# Patient Record
Sex: Female | Born: 2000 | Race: White | Hispanic: No | Marital: Single | Smoking: Never smoker
Health system: Southern US, Community
[De-identification: ages and names within clinical notes are randomized; demographics above are authoritative.]

## PROBLEM LIST (undated history)

## (undated) ENCOUNTER — Inpatient Hospital Stay (HOSPITAL_COMMUNITY): Payer: Self-pay

## (undated) DIAGNOSIS — D649 Anemia, unspecified: Secondary | ICD-10-CM

## (undated) DIAGNOSIS — B999 Unspecified infectious disease: Secondary | ICD-10-CM

## (undated) DIAGNOSIS — F32A Depression, unspecified: Secondary | ICD-10-CM

## (undated) HISTORY — PX: NO PAST SURGERIES: SHX2092

## (undated) NOTE — *Deleted (*Deleted)
POSTPARTUM PROGRESS NOTE  Subjective: Dawn Krause is a 38 y.o. [id number] s/p ***LTCS at [redacted]w[redacted]d.  She reports she doing well. No acute events overnight. She denies any problems with ambulating, voiding or po intake. Denies nausea or vomiting. She has *** passed flatus. Pain is {DESC; WELL/MODERATELY/POORLY:30679} controlled.  Lochia is ***.  Objective: Blood pressure (!) 101/53, pulse 93, temperature 98 F (36.7 C), temperature source Oral, resp. rate 18, SpO2 99 %, unknown if currently breastfeeding.  Physical Exam:  General: alert, cooperative and no distress Chest: no respiratory distress Abdomen: soft, non-tender  Uterine Fundus: firm and at level of umbilicus Extremities: No calf swelling or tenderness  *** edema  Recent Labs    03/16/20 1356  HGB 8.1*  HCT 27.7*    Assessment/Plan: Dawn Krause is a 50 y.o. A5W0981 s/p *** at [redacted]w[redacted]d for ***.  Routine Postpartum Care: Doing well, pain well-controlled.  -- Continue routine care, lactation support  -- Contraception: *** -- Feeding: ***  Dispo: Plan for discharge ***.  Sheila Oats, MD OB Fellow, Faculty Practice 03/17/2020 7:31 AM

---

## 2019-05-02 NOTE — L&D Delivery Note (Signed)
LABOR COURSE Patient was admitted for SOL. She SROM'd around 1600 hours and progressed to complete without further intervention.  Delivery Note Called to room and patient was complete and pushing. Head delivered LOT. No nuchal cord present. Shoulder and body delivered in usual fashion. At 1745 a viable female was delivered via Vaginal, Spontaneous (Presentation: LOT ; LOA).  Infant with spontaneous cry, placed on mother's abdomen, dried and stimulated. Cord clamped x 2 after one-minute delay, and cut by FOB Khalil. Cord blood drawn. Placenta delivered spontaneously with gentle cord traction. Appears intact. Fundus firm with massage and Pitocin. Labia, perineum, vagina, and cervix inspected. Per discussion with Dr. Adrian Blackwater, in setting of asymptomatic anemia, TXA given prophylactically    APGAR:9, 9; weight: 3246g  .   Cord: 3VC with the following complications: None.   Cord pH: not collected, not indicated  Anesthesia:  Epidural Episiotomy: None Lacerations: None Est. Blood Loss (mL): 50  Mom to postpartum.  Baby to Couplet care / Skin to Skin.  Clayton Bibles, CNM 03/16/20 9:06 PM

## 2020-03-16 ENCOUNTER — Inpatient Hospital Stay (HOSPITAL_COMMUNITY)
Admission: AD | Admit: 2020-03-16 | Discharge: 2020-03-18 | DRG: 807 | Disposition: A | Discharge status: Home / Self Care | Payer: Medicaid - Out of State | Attending: Family Medicine | Admitting: Family Medicine

## 2020-03-16 ENCOUNTER — Other Ambulatory Visit: Payer: Self-pay

## 2020-03-16 ENCOUNTER — Encounter (HOSPITAL_COMMUNITY): Payer: Self-pay | Admitting: Obstetrics and Gynecology

## 2020-03-16 ENCOUNTER — Inpatient Hospital Stay (HOSPITAL_COMMUNITY): Payer: Medicaid - Out of State | Admitting: Anesthesiology

## 2020-03-16 DIAGNOSIS — O9903 Anemia complicating the puerperium: Secondary | ICD-10-CM | POA: Diagnosis not present

## 2020-03-16 DIAGNOSIS — Z9889 Other specified postprocedural states: Secondary | ICD-10-CM | POA: Diagnosis not present

## 2020-03-16 DIAGNOSIS — O9902 Anemia complicating childbirth: Principal | ICD-10-CM | POA: Diagnosis present

## 2020-03-16 DIAGNOSIS — Z3A39 39 weeks gestation of pregnancy: Secondary | ICD-10-CM | POA: Diagnosis not present

## 2020-03-16 DIAGNOSIS — D649 Anemia, unspecified: Secondary | ICD-10-CM

## 2020-03-16 DIAGNOSIS — Z20822 Contact with and (suspected) exposure to covid-19: Secondary | ICD-10-CM | POA: Diagnosis present

## 2020-03-16 DIAGNOSIS — D509 Iron deficiency anemia, unspecified: Secondary | ICD-10-CM | POA: Diagnosis present

## 2020-03-16 DIAGNOSIS — O26893 Other specified pregnancy related conditions, third trimester: Secondary | ICD-10-CM | POA: Diagnosis present

## 2020-03-16 DIAGNOSIS — O0930 Supervision of pregnancy with insufficient antenatal care, unspecified trimester: Secondary | ICD-10-CM

## 2020-03-16 HISTORY — DX: Anemia, unspecified: D64.9

## 2020-03-16 HISTORY — DX: Unspecified infectious disease: B99.9

## 2020-03-16 LAB — WET PREP, GENITAL
Sperm: NONE SEEN
Trich, Wet Prep: NONE SEEN
Yeast Wet Prep HPF POC: NONE SEEN

## 2020-03-16 LAB — RAPID URINE DRUG SCREEN, HOSP PERFORMED
Amphetamines: NOT DETECTED
Barbiturates: NOT DETECTED
Benzodiazepines: NOT DETECTED
Cocaine: NOT DETECTED
Opiates: NOT DETECTED
Tetrahydrocannabinol: NOT DETECTED

## 2020-03-16 LAB — RESPIRATORY PANEL BY RT PCR (FLU A&B, COVID)
Influenza A by PCR: NEGATIVE
Influenza B by PCR: NEGATIVE
SARS Coronavirus 2 by RT PCR: NEGATIVE

## 2020-03-16 LAB — URINALYSIS, ROUTINE W REFLEX MICROSCOPIC
Bilirubin Urine: NEGATIVE
Glucose, UA: NEGATIVE mg/dL
Hgb urine dipstick: NEGATIVE
Ketones, ur: NEGATIVE mg/dL
Nitrite: NEGATIVE
Protein, ur: NEGATIVE mg/dL
Specific Gravity, Urine: 1.012 (ref 1.005–1.030)
pH: 7 (ref 5.0–8.0)

## 2020-03-16 LAB — CBC
HCT: 27.7 % — ABNORMAL LOW (ref 36.0–46.0)
Hemoglobin: 8.1 g/dL — ABNORMAL LOW (ref 12.0–15.0)
MCH: 21.3 pg — ABNORMAL LOW (ref 26.0–34.0)
MCHC: 29.2 g/dL — ABNORMAL LOW (ref 30.0–36.0)
MCV: 72.7 fL — ABNORMAL LOW (ref 80.0–100.0)
Platelets: 184 10*3/uL (ref 150–400)
RBC: 3.81 MIL/uL — ABNORMAL LOW (ref 3.87–5.11)
RDW: 16 % — ABNORMAL HIGH (ref 11.5–15.5)
WBC: 15.9 10*3/uL — ABNORMAL HIGH (ref 4.0–10.5)
nRBC: 0.1 % (ref 0.0–0.2)

## 2020-03-16 LAB — TYPE AND SCREEN
ABO/RH(D): A POS
Antibody Screen: NEGATIVE

## 2020-03-16 MED ORDER — OXYTOCIN BOLUS FROM INFUSION
333.0000 mL | Freq: Once | INTRAVENOUS | Status: AC
  Administered 2020-03-16: 333 mL via INTRAVENOUS

## 2020-03-16 MED ORDER — COCONUT OIL OIL: 1.0000 "application " | TOPICAL_OIL | Status: DC | PRN

## 2020-03-16 MED ORDER — IBUPROFEN 600 MG PO TABS: 600.0000 mg | ORAL_TABLET | Freq: Once | ORAL | Status: DC

## 2020-03-16 MED ORDER — BENZOCAINE-MENTHOL 20-0.5 % EX AERO: 1.0000 "application " | INHALATION_SPRAY | CUTANEOUS | Status: DC | PRN

## 2020-03-16 MED ORDER — IBUPROFEN 600 MG PO TABS
600.0000 mg | ORAL_TABLET | Freq: Four times a day (QID) | ORAL | Status: DC
  Administered 2020-03-16 – 2020-03-18 (×6): 600 mg via ORAL
  Filled 2020-03-16 (×5): qty 1

## 2020-03-16 MED ORDER — PHENYLEPHRINE 40 MCG/ML (10ML) SYRINGE FOR IV PUSH (FOR BLOOD PRESSURE SUPPORT): 80.0000 ug | PREFILLED_SYRINGE | INTRAVENOUS | Status: DC | PRN

## 2020-03-16 MED ORDER — SOD CITRATE-CITRIC ACID 500-334 MG/5ML PO SOLN: 30.0000 mL | ORAL | Status: DC | PRN

## 2020-03-16 MED ORDER — TETANUS-DIPHTH-ACELL PERTUSSIS 5-2.5-18.5 LF-MCG/0.5 IM SUSY: 0.5000 mL | PREFILLED_SYRINGE | Freq: Once | INTRAMUSCULAR | Status: DC

## 2020-03-16 MED ORDER — MAGNESIUM HYDROXIDE 400 MG/5ML PO SUSP: 30.0000 mL | ORAL | Status: DC | PRN

## 2020-03-16 MED ORDER — WITCH HAZEL-GLYCERIN EX PADS: 1.0000 "application " | MEDICATED_PAD | CUTANEOUS | Status: DC | PRN

## 2020-03-16 MED ORDER — SODIUM CHLORIDE (PF) 0.9 % IJ SOLN
INTRAMUSCULAR | Status: DC | PRN
  Administered 2020-03-16: 12 mL/h via EPIDURAL

## 2020-03-16 MED ORDER — EPHEDRINE 5 MG/ML INJ: 10.0000 mg | INTRAVENOUS | Status: DC | PRN

## 2020-03-16 MED ORDER — PROMETHAZINE HCL 25 MG/ML IJ SOLN
25.0000 mg | Freq: Once | INTRAMUSCULAR | Status: AC
  Administered 2020-03-16: 25 mg via INTRAMUSCULAR
  Filled 2020-03-16: qty 1

## 2020-03-16 MED ORDER — TRANEXAMIC ACID-NACL 1000-0.7 MG/100ML-% IV SOLN: 1000.0000 mg | Freq: Once | INTRAVENOUS | Status: DC

## 2020-03-16 MED ORDER — FENTANYL-BUPIVACAINE-NACL 0.5-0.125-0.9 MG/250ML-% EP SOLN
12.0000 mL/h | EPIDURAL | Status: DC | PRN
  Filled 2020-03-16: qty 250

## 2020-03-16 MED ORDER — LACTATED RINGERS IV SOLN
500.0000 mL | INTRAVENOUS | Status: DC | PRN
  Administered 2020-03-16: 1000 mL via INTRAVENOUS

## 2020-03-16 MED ORDER — LIDOCAINE HCL (PF) 1 % IJ SOLN: 30.0000 mL | INTRAMUSCULAR | Status: DC | PRN

## 2020-03-16 MED ORDER — TRANEXAMIC ACID-NACL 1000-0.7 MG/100ML-% IV SOLN
INTRAVENOUS | Status: AC
  Administered 2020-03-16: 1000 mg
  Filled 2020-03-16: qty 100

## 2020-03-16 MED ORDER — OXYTOCIN-SODIUM CHLORIDE 30-0.9 UT/500ML-% IV SOLN
2.5000 [IU]/h | INTRAVENOUS | Status: DC
  Filled 2020-03-16: qty 500

## 2020-03-16 MED ORDER — ACETAMINOPHEN 325 MG PO TABS: 650.0000 mg | ORAL_TABLET | ORAL | Status: DC | PRN

## 2020-03-16 MED ORDER — LIDOCAINE HCL (PF) 1 % IJ SOLN
INTRAMUSCULAR | Status: DC | PRN
  Administered 2020-03-16: 5 mL via EPIDURAL

## 2020-03-16 MED ORDER — DIBUCAINE (PERIANAL) 1 % EX OINT: 1.0000 "application " | TOPICAL_OINTMENT | CUTANEOUS | Status: DC | PRN

## 2020-03-16 MED ORDER — LACTATED RINGERS IV SOLN: 500.0000 mL | Freq: Once | INTRAVENOUS | Status: DC

## 2020-03-16 MED ORDER — ONDANSETRON HCL 4 MG/2ML IJ SOLN: 4.0000 mg | INTRAMUSCULAR | Status: DC | PRN

## 2020-03-16 MED ORDER — BUTORPHANOL TARTRATE 1 MG/ML IJ SOLN
0.5000 mg | Freq: Once | INTRAMUSCULAR | Status: AC
  Administered 2020-03-16: 0.5 mg via INTRAMUSCULAR
  Filled 2020-03-16: qty 1

## 2020-03-16 MED ORDER — DIPHENHYDRAMINE HCL 25 MG PO CAPS: 25.0000 mg | ORAL_CAPSULE | Freq: Four times a day (QID) | ORAL | Status: DC | PRN

## 2020-03-16 MED ORDER — LACTATED RINGERS IV SOLN: INTRAVENOUS | Status: DC

## 2020-03-16 MED ORDER — ONDANSETRON HCL 4 MG PO TABS: 4.0000 mg | ORAL_TABLET | ORAL | Status: DC | PRN

## 2020-03-16 MED ORDER — FERROUS SULFATE 325 (65 FE) MG PO TABS: 325.0000 mg | ORAL_TABLET | Freq: Two times a day (BID) | ORAL | Status: DC

## 2020-03-16 MED ORDER — ONDANSETRON HCL 4 MG/2ML IJ SOLN: 4.0000 mg | Freq: Four times a day (QID) | INTRAMUSCULAR | Status: DC | PRN

## 2020-03-16 MED ORDER — SIMETHICONE 80 MG PO CHEW: 80.0000 mg | CHEWABLE_TABLET | ORAL | Status: DC | PRN

## 2020-03-16 MED ORDER — DIPHENHYDRAMINE HCL 50 MG/ML IJ SOLN: 12.5000 mg | INTRAMUSCULAR | Status: DC | PRN

## 2020-03-16 MED ORDER — PRENATAL MULTIVITAMIN CH
1.0000 | ORAL_TABLET | Freq: Every day | ORAL | Status: DC
  Administered 2020-03-17 – 2020-03-18 (×2): 1 via ORAL
  Filled 2020-03-16 (×2): qty 1

## 2020-03-16 NOTE — Discharge Instructions (Signed)

## 2020-03-16 NOTE — Progress Notes (Signed)
Dawn Krause is a 19 y.o. G2P1001 at [redacted]w[redacted]d   Subjective: S/p epidural. Dawn Krause and Khalil's mom at bedside.  Objective: BP (!) 96/54   Pulse 91   Temp 98.6 F (37 C) (Oral)   Resp 16   SpO2 100%  No intake/output data recorded. No intake/output data recorded.  FHT:  FHR: 120 bpm, variability: moderate,  accelerations:  Present,  decelerations:  Absent UC:   regular, every 2-4 minutes SVE:   Dilation: 8 Effacement (%): 100 Station: -2 Exam by:: k fields, rn  Labs: Lab Results  Component Value Date   WBC 15.9 (H) 03/16/2020   HGB 8.1 (L) 03/16/2020   HCT 27.7 (L) 03/16/2020   MCV 72.7 (L) 03/16/2020   PLT 184 03/16/2020    Assessment / Plan: --19 y.o. G2P1001 at [redacted]w[redacted]d  --Cat I tracing --Epidural placed and effective, pain score 0/10 --SROM at 1600, clear fluid --Patient to nap for ~1 hour (1715), then initiate position changes with peanut ball --Anticipate NSVD  Calvert Cantor, CNM 03/16/2020, 4:20 PM

## 2020-03-16 NOTE — Progress Notes (Signed)
Dawn Krause is a 19 y.o. G2P1001 at [redacted]w[redacted]d admitted for SOL  Subjective: Tearful and vocal with contractions. Requests epidural and believes she will be able to tolerate position while it is placed. Verbalizes "I just want it to be over". FOB Heron Nay and Khalil's mom at bedside.   Objective: BP 129/72   Pulse (!) 110   Temp 98 F (36.7 C) (Oral)   Resp 19   SpO2 98%  No intake/output data recorded. No intake/output data recorded.  FHT:  FHR: 125 bpm, variability: moderate,  Accelerations:15 x 15 prior to Stadol and Phenergan in MAU,  decelerations:  Absent UC:   irregular, every 2-5 minutes SVE:   Dilation: 7 Effacement (%): 100 Station: -2 Exam by:: k fields, rn  Labs: Lab Results  Component Value Date   WBC 15.9 (H) 03/16/2020   HGB 8.1 (L) 03/16/2020   HCT 27.7 (L) 03/16/2020   MCV 72.7 (L) 03/16/2020   PLT 184 03/16/2020    Assessment / Plan: --19 y.o. G2P1001 at [redacted]w[redacted]d  --Cat I tracing prior to analgesia in MAU --Epidural now --AROM once comfortable with epidural --Admission Hgb 8.1, plan preemptively give TXA at delivery. Coordinated with Dr. Adrian Blackwater --Anticipate NSVD  Calvert Cantor, CNM 03/16/2020, 2:50 PM

## 2020-03-16 NOTE — H&P (Signed)
Dawn Krause is a 19 y.o. female presenting for spontaneous onset of labor. She states she woke up with painful contractions this morning. She denies vaginal bleeding, leaking of fluid, decreased fetal movement, fever, falls, or recent illness.   Patient is new to [place]; she recently moved from [place]. She states she has not been seen for prenatal care in about 4 weeks. She denies complications this pregnancy. Patient was unable to attend the iron infusion appointments made for her but endorses taking PO iron "most of the time".She is s/p vaginal delivery 12/15/2018. She is accompanied by her partner Heron Nay.  OB History    Gravida  2   Para  1   Term  1   Preterm      AB      Living  1     SAB      TAB      Ectopic      Multiple      Live Births  1          Past Medical History:  Diagnosis Date   Anemia    Infection    UTI   Past Surgical History:  Procedure Laterality Date   NO PAST SURGERIES     Family History: family history includes Diabetes in her mother; Healthy in her father; Hypertension in her mother. Social History:  reports that she has never smoked. She has never used smokeless tobacco. She reports that she does not drink alcohol and does not use drugs.    Maternal Diabetes: No Genetic Screening: Declined Maternal Ultrasounds/Referrals: Normal Fetal Ultrasounds or other Referrals:  None Maternal Substance Abuse:  No UDS ordered on admission Significant Maternal Medications:  None Significant Maternal Lab Results:  None  GBS Unknown Other Comments:  Interrupted prenatal care  Review of Systems  Gastrointestinal: Positive for abdominal pain.  All other systems reviewed and are negative.    Dilation: 3 Effacement (%): 80 Exam by:: Anna C Blood pressure (!) 129/59, pulse 99, temperature 97.9 F (36.6 C), temperature source Oral, SpO2 98 %.   Physical Exam Vitals and nursing note reviewed. Exam conducted with a chaperone present.   Cardiovascular:     Rate and Rhythm: Normal rate.     Pulses: Normal pulses.     Heart sounds: Normal heart sounds.  Pulmonary:     Effort: Pulmonary effort is normal.     Breath sounds: Normal breath sounds.  Abdominal:     Tenderness: There is no abdominal tenderness. There is no right CVA tenderness or left CVA tenderness.     Comments: Gravid  Skin:    General: Skin is warm and dry.     Capillary Refill: Capillary refill takes less than 2 seconds.  Neurological:     General: No focal deficit present.     Mental Status: She is alert.  Psychiatric:        Mood and Affect: Mood normal.        Behavior: Behavior normal.        Thought Content: Thought content normal.        Judgment: Judgment normal.      Prenatal labs: ABO, Rh: --/--/PENDING (11/16 1348) Antibody: PENDING (11/16 1348) Rubella:  Immune  RPR:    HBsAg:   Neg Hep C Negative HIV:    GBS:   Unknown, collected in MAU TDAP: 01/07/2020  Fetal Surveillance --Cat I tracing --Baseline 120, mod var, + 15 x 15 accels, no decels --Toco: Reg contractions q  3 min   Assessment/Plan: --19 y.o. G2P1001 at [redacted]w[redacted]d  --Interrupted prenatal care, records visible in Care Everywhere --Hgb 8.6 as of 01/07/2020. Currently asymptomatic --SOL, cervical change from 3/80 to 5/100 in MAU per RN exam --Cat I tracing --Membranes intact --GBS unknown: does not meet criteria for treatment --Epidural when labs result --boy/oupt circ/breast/uncertain --Expectant management, anticipate NSVD  Postpartum Planning --Social work consult --Contraception discussion --4 week postpartum with [facility]  Calvert Cantor, CNM 03/16/2020, 2:12 PM

## 2020-03-16 NOTE — MAU Note (Signed)
Care in [place], last  Seen about a month ago. No problems with preg. No bleeding or LOF, had mucous d/c last couple days. No recent exam.  ctxs woke her this morinig

## 2020-03-16 NOTE — Anesthesia Preprocedure Evaluation (Signed)
Anesthesia Evaluation  Patient identified by MRN, date of birth, ID band Patient awake    Reviewed: Allergy & Precautions, NPO status , Patient's Chart, lab work & pertinent test results  Airway Mallampati: II  TM Distance: >3 FB Neck ROM: Full    Dental no notable dental hx. (+) Teeth Intact, Dental Advisory Given   Pulmonary neg pulmonary ROS,    Pulmonary exam normal breath sounds clear to auscultation       Cardiovascular Exercise Tolerance: Good negative cardio ROS Normal cardiovascular exam Rhythm:Regular Rate:Normal     Neuro/Psych negative neurological ROS  negative psych ROS   GI/Hepatic negative GI ROS, Neg liver ROS,   Endo/Other  negative endocrine ROS  Renal/GU negative Renal ROS     Musculoskeletal negative musculoskeletal ROS (+)   Abdominal   Peds  Hematology  (+) anemia , Hgb 8.1 Plt 184   Anesthesia Other Findings   Reproductive/Obstetrics (+) Pregnancy                             Anesthesia Physical Anesthesia Plan  ASA: II  Anesthesia Plan: Epidural   Post-op Pain Management:    Induction:   PONV Risk Score and Plan:   Airway Management Planned:   Additional Equipment:   Intra-op Plan:   Post-operative Plan:   Informed Consent: I have reviewed the patients History and Physical, chart, labs and discussed the procedure including the risks, benefits and alternatives for the proposed anesthesia with the patient or authorized representative who has indicated his/her understanding and acceptance.       Plan Discussed with: CRNA  Anesthesia Plan Comments: (39.2 wk G2P1  w Anemia for LEA)        Anesthesia Quick Evaluation

## 2020-03-16 NOTE — Discharge Summary (Signed)
Postpartum Discharge Summary    Patient Name: Dawn Krause DOB: [birth year] MRN: [id number]  Date of admission: 03/16/2020 Delivery date:03/16/2020  Delivering provider: Darlina Rumpf  Date of discharge: 03/18/2020  Admitting diagnosis: Labor and delivery indication for care or intervention [O75.9] Intrauterine pregnancy: [redacted]w[redacted]d    Secondary diagnosis:  Active Problems:   Anemia   Insufficient prenatal care   NSVD (normal spontaneous vaginal delivery)   Labor and delivery indication for care or intervention  Additional problems: as noted above   Discharge diagnosis: Vaginal delivery                                           Post partum procedures:none Augmentation: none Complications: None  Hospital course: Onset of Labor With Vaginal Delivery      19y.o. yo [id number] 332w2das admitted in Latent Labor on 03/16/2020. Patient had an uncomplicated labor course as follows:  Membrane Rupture Time/Date: 4:04 PM ,03/16/2020   Delivery Method:Vaginal, Spontaneous  Episiotomy: None  Lacerations:  None  Patient had an uncomplicated postpartum course.  She is ambulating, tolerating a regular diet, passing flatus, and urinating well. Patient is discharged home in stable condition on 03/18/20.  Newborn Data: Birth date:03/16/2020  Birth time:5:45 PM  Gender:Female  Living status:Living  Apgars:9 ,9  Weight:3246 g   Magnesium Sulfate received: No BMZ received: No Rhophylac:N/A MMR: rubella screen pending at time of discharge T-DaP: offered prior to discharge Flu: offered prior to discharge Transfusion:No  Physical exam  Vitals:   03/17/20 0910 03/17/20 1413 03/17/20 1945 03/18/20 0530  BP: 107/61 (!) 106/57 116/77 113/70  Pulse: 89 97 92 100  Resp: _0 Temp: 98.2 F (36.8 C) 98.1 F (36.7 C) 98 F (36.7 C) 98.2 F (36.8 C)  TempSrc: Oral Oral Oral Oral  SpO2:  99%     General: alert, cooperative and no distress Lochia: appropriate Uterine Fundus:  firm Incision: N/A DVT Evaluation: No evidence of DVT seen on physical exam. No cords or calf tenderness. No significant calf/ankle edema. Labs: Lab Results  Component Value Date   WBC 14.7 (H) 03/17/2020   HGB 7.6 (L) 03/17/2020   HCT 25.5 (L) 03/17/2020   MCV 74.1 (L) 03/17/2020   PLT 166 03/17/2020   No flowsheet data found. Edinburgh Score: Edinburgh Postnatal Depression Scale Screening Tool 03/16/2020  I have been able to laugh and see the funny side of things. 0  I have looked forward with enjoyment to things. 0  I have blamed myself unnecessarily when things went wrong. 0  I have been anxious or worried for no good reason. 0  I have felt scared or panicky for no good reason. 0  Things have been getting on top of me. 0  I have been so unhappy that I have had difficulty sleeping. 0  I have felt sad or miserable. 0  I have been so unhappy that I have been crying. 0  The thought of harming myself has occurred to me. 0  Edinburgh Postnatal Depression Scale Total 0     After visit meds:  Allergies as of 03/18/2020   No Known Allergies     Medication List    TAKE these medications   acetaminophen 325 MG tablet Commonly known as: Tylenol Take 2 tablets (650 mg total) by mouth every 6 (six) hours as needed  for mild pain, moderate pain, fever or headache. What changed:   how much to take  reasons to take this   coconut oil Oil Apply 1 application topically as needed (nipple pain).   ferrous sulfate 325 (65 FE) MG tablet Take 1 tablet (325 mg total) by mouth every other day. Start taking on: March 19, 2020   ibuprofen 600 MG tablet Commonly known as: ADVIL Take 1 tablet (600 mg total) by mouth every 8 (eight) hours as needed for headache, mild pain, moderate pain or cramping.   norethindrone 0.35 MG tablet Commonly known as: Ortho Micronor Take 1 tablet (0.35 mg total) by mouth daily.   PRENATAL VITAMIN PO Take by mouth.      Discharge home in stable  condition Infant Feeding: No evidence of DVT seen on physical exam. No cords or calf tenderness. No significant calf/ankle edema. Infant Disposition:home with mother Discharge instruction: per After Visit Summary and Postpartum booklet. Activity: Advance as tolerated. Pelvic rest for 6 weeks.  Diet: routine diet Future Appointments: Future Appointments  Date Time Provider [place]  04/15/2020  2:00 PM Gavin Pound, CNM [facility] None   Follow up Visit: Message sent to [facility] by Jeronimo Greaves, CNM on 03/16/2020.  Please schedule this patient for a In person postpartum visit in 4 weeks with the following provider: Any provider. Additional Postpartum F/U:Postpartum Depression checkup 1 week Low risk pregnancy complicated by: N/A Delivery mode:  Vaginal, Spontaneous  Anticipated Birth Control:  POPs  Christionna Poland, Gildardo Cranker, MD OB Fellow, Faculty Practice 03/18/2020 6:52 AM

## 2020-03-16 NOTE — Anesthesia Procedure Notes (Signed)
Epidural Patient location during procedure: OB Start time: 03/16/2020 2:52 PM End time: 03/16/2020 3:06 PM  Staffing Anesthesiologist: Trevor Iha, MD Performed: anesthesiologist   Preanesthetic Checklist Completed: patient identified, IV checked, site marked, risks and benefits discussed, surgical consent, monitors and equipment checked, pre-op evaluation and timeout performed  Epidural Patient position: sitting Prep: DuraPrep and site prepped and draped Patient monitoring: continuous pulse ox and blood pressure Approach: midline Location: L3-L4 Injection technique: LOR air  Needle:  Needle type: Tuohy  Needle gauge: 17 G Needle length: 9 cm and 9 Needle insertion depth: 5 cm cm Catheter type: closed end flexible Catheter size: 19 Gauge Catheter at skin depth: 10 cm Test dose: negative  Assessment Events: blood not aspirated, injection not painful, no injection resistance, no paresthesia and negative IV test  Additional Notes Patient identified. Risks/Benefits/Options discussed with patient including but not limited to bleeding, infection, nerve damage, paralysis, failed block, incomplete pain control, headache, blood pressure changes, nausea, vomiting, reactions to medication both or allergic, itching and postpartum back pain. Confirmed with bedside nurse the patient's most recent platelet count. Confirmed with patient that they are not currently taking any anticoagulation, have any bleeding history or any family history of bleeding disorders. Patient expressed understanding and wished to proceed. All questions were answered. Sterile technique was used throughout the entire procedure. Please see nursing notes for vital signs. Test dose was given through epidural needle and negative prior to continuing to dose epidural or start infusion. Warning signs of high block given to the patient including shortness of breath, tingling/numbness in hands, complete motor block, or any  concerning symptoms with instructions to call for help. Patient was given instructions on fall risk and not to get out of bed. All questions and concerns addressed with instructions to call with any issues.1  Attempt (S) . Patient tolerated procedure well.

## 2020-03-17 DIAGNOSIS — O9903 Anemia complicating the puerperium: Secondary | ICD-10-CM

## 2020-03-17 LAB — CBC
HCT: 25.5 % — ABNORMAL LOW (ref 36.0–46.0)
Hemoglobin: 7.6 g/dL — ABNORMAL LOW (ref 12.0–15.0)
MCH: 22.1 pg — ABNORMAL LOW (ref 26.0–34.0)
MCHC: 29.8 g/dL — ABNORMAL LOW (ref 30.0–36.0)
MCV: 74.1 fL — ABNORMAL LOW (ref 80.0–100.0)
Platelets: 166 10*3/uL (ref 150–400)
RBC: 3.44 MIL/uL — ABNORMAL LOW (ref 3.87–5.11)
RDW: 16.2 % — ABNORMAL HIGH (ref 11.5–15.5)
WBC: 14.7 10*3/uL — ABNORMAL HIGH (ref 4.0–10.5)
nRBC: 0.1 % (ref 0.0–0.2)

## 2020-03-17 LAB — GC/CHLAMYDIA PROBE AMP (~~LOC~~) NOT AT ARMC
Chlamydia: NEGATIVE
Comment: NEGATIVE
Comment: NORMAL
Neisseria Gonorrhea: NEGATIVE

## 2020-03-17 LAB — RPR: RPR Ser Ql: NONREACTIVE

## 2020-03-17 MED ORDER — FERROUS SULFATE 325 (65 FE) MG PO TABS
325.0000 mg | ORAL_TABLET | ORAL | Status: DC
  Administered 2020-03-17: 325 mg via ORAL
  Filled 2020-03-17: qty 1

## 2020-03-17 NOTE — Social Work (Signed)
CSW consulted for interrupted/insufficient prenatal care & short interval pregnancy. Also consulted for possible custody issues with 20 year old. Upon chart review, CSW noted MOB had an initial prenatal visit May 17 at [redacted]w[redacted]d CSW noted another "initial visit" dated on August 9, and a routine visit on Sept 8. MOB had prenatal care prior to 28 weeks and 3 total visits documented. Newborns UDS resulted negative for substances. CSW will not follow cord, as documented prenatal care does not meet criteria for automatic CSW involvement and infant drug screening.  CSW met with MOB to assess custody situation and offer support. CSW entered room and observed newborns paternal grandmother in chair and FOB on couch. Newborn was observed sleeping in bassinet. CSW asked MOB if she would like to speak alone for privacy and to respect HIPAA. MOB declined and stated everyone could remain in the room.  MOB reported [place] moved here from [place] MOB [place]. CSW asked MOB the details of custody surrounding her 11year old daughter ZZaelynn Fuchs(12/15/2018). MOB stated she missed a scheduled court date to determine custody due to a job she had traveling. MOB stated she did not realize she was mailed the court date and when it was missed the FOB was awarded custody. MOB does not have address for daughter, stating she knows they reside in [place] MOB stated she is still able to see her 142year old daughter and denies ever having any CPS involvement in [place] MOB reported she still has parental rights to her daughter.  MOB reports she receives food stamps and [facility] CS[facility] provided MOB information [facility]. MOB denies any mental health history and stated she is currently feeling okay. MOB denies any current SI or HI. CSW provided education regarding the baby blues  period vs. perinatal mood disorders, discussed treatment and offered resources for mental health follow up if concerns arise.  CSW recommends self-evaluation during the postpartum time period using the New Mom Checklist from Postpartum Progress and encouraged MOB to contact a medical professional if symptoms are noted at any time. MOB expressed understanding and denies previous experiencing PPD.     CSW provided review of Sudden Infant Death Syndrome (SIDS) precautions. MOB stated newborn will sleep in a crib. MOB reports she has all of the essential needs for newborn, including a new carseat. [facility] MOB denies any transportation barriers. MOB declined a referral to [employer]or other community resources. MOB expressed no addition concerns or needs at this time.     CSW identifies no further need for intervention and no barriers to discharge at this time.  CDarra Lis LGarden CityWork WEnterprise Productsand CMolson [employer]([phone]

## 2020-03-17 NOTE — Anesthesia Postprocedure Evaluation (Signed)
Anesthesia Post Note  Patient: Dawn Krause  Procedure(s) Performed: AN AD HOC LABOR EPIDURAL     Patient location during evaluation: Mother Baby Anesthesia Type: Epidural Level of consciousness: awake and alert Pain management: pain level controlled Vital Signs Assessment: post-procedure vital signs reviewed and stable Respiratory status: spontaneous breathing Cardiovascular status: stable Postop Assessment: no headache, adequate PO intake, no backache, patient able to bend at knees, able to ambulate, epidural receding and no apparent nausea or vomiting Anesthetic complications: no   No complications documented.  Last Vitals:  Vitals:   03/17/20 0115 03/17/20 0500  BP: 104/62 (!) 101/53  Pulse: 89 93  Resp: 16 18  Temp: 37.1 C 36.7 C  SpO2: 98% 99%    Last Pain:  Vitals:   03/17/20 0500  TempSrc: Oral  PainSc: 3    Pain Goal:                   Salome Arnt

## 2020-03-17 NOTE — Progress Notes (Addendum)
POSTPARTUM PROGRESS NOTE  Subjective: Dawn Krause is a 19 y.o. M3W4665 s/p SVD LTCS at [redacted]w[redacted]d.  She reports she doing well. No acute events overnight. She denies any problems with ambulating, voiding or po intake. Denies nausea or vomiting. She has passed flatus. Pain is well controlled.  Minimal lochia is present.  Objective: Blood pressure (!) 101/53, pulse 93, temperature 98 F (36.7 C), temperature source Oral, resp. rate 18, SpO2 99 %, unknown if currently breastfeeding.  Physical Exam:  General: alert, cooperative and no distress Chest: no respiratory distress Abdomen: soft, non-tender  Uterine Fundus: firm and below level of umbilicus Extremities: No calf swelling or tenderness, no LE edema noted  Recent Labs    03/16/20 1356  HGB 8.1*  HCT 27.7*    Assessment/Plan: Dawn Krause is a 19 y.o. [id number] PPD#1.  Anemia: Hgb 8.1 on admission, otherwise asymptomatic. PO iron supplements ordered. Will f/u PP H&H to ensure stable.  Routine Postpartum Care: Doing well, pain well-controlled.  -- Continue routine care, lactation support  --SW consult placed. -- Contraception: undecided at this time, counseled on options -- Feeding: breast  Dispo: Plan for discharge anticipated tomorrow 11/18.  Reece Leader, DO 03/17/2020 7:43 AM  Attestation of Supervision of Student:  I confirm that I have verified the information documented in the  resident's  note and that I have also personally reperformed the history, physical exam and all medical decision making activities.  I have verified that all services and findings are accurately documented in this student's note; and I agree with management and plan as outlined in the documentation. I have also made any necessary editorial changes.  Sheila Oats, MD Center for [facility], [facility] 03/17/2020 9:16 AM

## 2020-03-18 DIAGNOSIS — Z9889 Other specified postprocedural states: Secondary | ICD-10-CM

## 2020-03-18 LAB — RUBELLA SCREEN: Rubella: 1.15 index (ref >0.99)

## 2020-03-18 LAB — CULTURE, BETA STREP (GROUP B ONLY)

## 2020-03-18 MED ORDER — COCONUT OIL OIL
1.0000 | TOPICAL_OIL | 0 refills | Status: DC | PRN
Start: 2020-03-18 — End: 2020-10-15

## 2020-03-18 MED ORDER — NORETHINDRONE 0.35 MG PO TABS: 1.0000 | ORAL_TABLET | Freq: Every day | ORAL | 6 refills | Status: DC

## 2020-03-18 MED ORDER — IBUPROFEN 600 MG PO TABS: 600.0000 mg | ORAL_TABLET | Freq: Three times a day (TID) | ORAL | 0 refills | Status: DC | PRN

## 2020-03-18 MED ORDER — FERROUS SULFATE 325 (65 FE) MG PO TABS
325.0000 mg | ORAL_TABLET | ORAL | 0 refills | Status: DC
Start: 2020-03-19 — End: 2022-01-11

## 2020-03-18 MED ORDER — ACETAMINOPHEN 325 MG PO TABS: 650.0000 mg | ORAL_TABLET | Freq: Four times a day (QID) | ORAL | Status: DC | PRN

## 2020-04-15 ENCOUNTER — Ambulatory Visit: Payer: Medicaid - Out of State

## 2020-10-12 ENCOUNTER — Other Ambulatory Visit: Payer: Self-pay

## 2020-10-12 ENCOUNTER — Ambulatory Visit (INDEPENDENT_AMBULATORY_CARE_PROVIDER_SITE_OTHER): Payer: Medicaid Other | Admitting: *Deleted

## 2020-10-12 VITALS — BP 121/76 | HR 103 | Ht 62.0 in | Wt 110.0 lb

## 2020-10-12 DIAGNOSIS — Z3201 Encounter for pregnancy test, result positive: Secondary | ICD-10-CM

## 2020-10-12 DIAGNOSIS — Z3200 Encounter for pregnancy test, result unknown: Secondary | ICD-10-CM

## 2020-10-12 LAB — POCT PREGNANCY, URINE: Preg Test, Ur: POSITIVE — AB

## 2020-10-12 NOTE — Progress Notes (Signed)
Chart reviewed for nurse visit. Agree with plan of care.   Venora Maples, MD 10/12/20 4:46 PM

## 2020-10-12 NOTE — Patient Instructions (Addendum)
Prenatal Care Providers           Center for [facility] @ [facility]  [address] [phone]  Center for [facility] @ [facility]   [address]  [phone]  Center For [facility] @ [facility]       [address] [phone]            Center for [facility] @ [place]     [phone]          Center for [facility] @ [facility]   [address] [phone]  Center for [facility] @ [facility]  [address] [phone]     Center for [facility] @ [address] [place])  [address]   [phone]     [facility]  Phone: [phone]  [facility]  Phone: [phone]  [facility] Phone: [phone]  [facility] Phone: [phone]  [facility] Phone: [phone]  [facility] Phone: [phone]  [facility]  Phone: [phone]    Call MyChart help desk at [phone] for help with linking MyChart

## 2020-10-12 NOTE — Progress Notes (Signed)
Here for pregnancy test today which was positive. Reports LMP 07/15/20- sure of date. States + home pregnancy test end of April. Reports 2 normal pregnancies. Her EDD will be 04/21/21 and [redacted]w[redacted]d pregnant. Advised to start prenatal vitamins. Advised to start prenatal care with provider of her choice- would like to come here or one of [facility] offices and will discuss with registrar at checkout. List of prenatal providers placed in AVS. Patient voices understanding.  Dr. Crissie Reese to meet patient since she has not been seen in our offices before.  Jalayna Josten,RN

## 2020-10-15 ENCOUNTER — Ambulatory Visit (INDEPENDENT_AMBULATORY_CARE_PROVIDER_SITE_OTHER): Payer: Medicaid Other

## 2020-10-15 ENCOUNTER — Other Ambulatory Visit: Payer: Self-pay

## 2020-10-15 VITALS — BP 129/75 | HR 69 | Ht 62.0 in | Wt 111.9 lb

## 2020-10-15 DIAGNOSIS — Z3A12 12 weeks gestation of pregnancy: Secondary | ICD-10-CM

## 2020-10-15 DIAGNOSIS — O3680X0 Pregnancy with inconclusive fetal viability, not applicable or unspecified: Secondary | ICD-10-CM

## 2020-10-15 DIAGNOSIS — Z3481 Encounter for supervision of other normal pregnancy, first trimester: Secondary | ICD-10-CM | POA: Diagnosis not present

## 2020-10-15 DIAGNOSIS — Z3491 Encounter for supervision of normal pregnancy, unspecified, first trimester: Secondary | ICD-10-CM | POA: Insufficient documentation

## 2020-10-15 MED ORDER — BLOOD PRESSURE KIT DEVI
1.0000 | 0 refills | Status: DC
Start: 2020-10-15 — End: 2023-04-16

## 2020-10-15 MED ORDER — GOJJI WEIGHT SCALE MISC: 1.0000 | 0 refills | Status: DC

## 2020-10-15 NOTE — Progress Notes (Signed)
New OB Intake  I connected with  Dawn Krause on 10/15/20 at 10:15 AM EDT by In person. Video Visit and verified that I am speaking with the correct person using two identifiers. Nurse is located at [facility] and pt is located at [place].  I discussed the limitations, risks, security and privacy concerns of performing an evaluation and management service by telephone and the availability of in person appointments. I also discussed with the patient that there may be a patient responsible charge related to this service. The patient expressed understanding and agreed to proceed.  I explained I am completing New OB Intake today. We discussed her EDD of 04/21/21 that is based on LMP of 07/15/20. Pt is G3/P2002. I reviewed her allergies, medications, Medical/Surgical/OB history, and appropriate screenings. I informed her of [facility] services. Based on history, this is a/an  pregnancy uncomplicated .   Patient Active Problem List   Diagnosis Date Noted   Labor and delivery indication for care or intervention 03/16/2020   Anemia    Insufficient prenatal care    NSVD (normal spontaneous vaginal delivery)     Concerns addressed today  Delivery Plans:  Plans to deliver at [facility].   MyChart/Babyscripts MyChart access verified. I explained pt will have some visits in office and some virtually. Babyscripts instructions given and order placed. Patient verifies receipt of registration text/e-mail. Account successfully created and app downloaded.  Blood Pressure Cuff  Blood pressure cuff ordered for patient to pick-up from [employer]. Explained after first prenatal appt pt will check weekly and document in Babyscripts.  Weight scale: Patient    have weight scale. Weight scale ordered   Anatomy US Explained first scheduled [place] will be around 19 weeks. Dating and viability scan performed today.  Labs Discussed Avelina Laine genetic screening with patient. Would like both Panorama and Horizon drawn at new OB  visit. Routine prenatal labs needed.  Covid Vaccine Patient has not covid vaccine.   Mother/ Baby Dyad Candidate?    If yes, offer as possibility  Inform patient of [facility] and place . In AVS   Social Determinants of Health Food Insecurity: Patient denies food insecurity. WIC Referral: Patient is interested in referral to [facility].  Transportation: Patient denies transportation needs. Childcare: Discussed no children allowed at ultrasound appointments. Offered childcare services; patient declines childcare services at this time.  First visit review I reviewed new OB appt with pt. I explained she will have a pelvic exam, ob bloodwork with genetic screening, and PAP smear. Explained pt will be seen by Coral Ceo at first visit; encounter routed to appropriate provider. Explained that patient will be seen by pregnancy navigator following visit with provider. [facility] information placed in AVS.   Hamilton Capri, RN 10/15/2020  10:38 AM

## 2020-10-15 NOTE — Progress Notes (Signed)
Patient was assessed and managed by nursing staff during this encounter. I have reviewed the chart and agree with the documentation and plan. I have also made any necessary editorial changes.  Catalina Antigua, MD 10/15/2020 11:34 AM

## 2020-10-19 ENCOUNTER — Encounter: Payer: Self-pay | Admitting: Obstetrics

## 2020-10-19 ENCOUNTER — Other Ambulatory Visit: Payer: Self-pay

## 2020-10-19 ENCOUNTER — Other Ambulatory Visit (HOSPITAL_COMMUNITY)
Admission: RE | Admit: 2020-10-19 | Discharge: 2020-10-19 | Disposition: A | Discharge status: Home / Self Care | Payer: Medicaid Other | Source: Ambulatory Visit | Attending: Obstetrics | Admitting: Obstetrics

## 2020-10-19 ENCOUNTER — Ambulatory Visit (INDEPENDENT_AMBULATORY_CARE_PROVIDER_SITE_OTHER): Payer: Medicaid Other | Admitting: Obstetrics

## 2020-10-19 VITALS — BP 116/82 | HR 107 | Wt 113.0 lb

## 2020-10-19 DIAGNOSIS — Z3A13 13 weeks gestation of pregnancy: Secondary | ICD-10-CM | POA: Diagnosis not present

## 2020-10-19 DIAGNOSIS — Z3480 Encounter for supervision of other normal pregnancy, unspecified trimester: Secondary | ICD-10-CM | POA: Insufficient documentation

## 2020-10-19 DIAGNOSIS — O09899 Supervision of other high risk pregnancies, unspecified trimester: Secondary | ICD-10-CM

## 2020-10-19 DIAGNOSIS — O99019 Anemia complicating pregnancy, unspecified trimester: Secondary | ICD-10-CM

## 2020-10-19 MED ORDER — VITAFOL ULTRA 29-0.6-0.4-200 MG PO CAPS: 1.0000 | ORAL_CAPSULE | Freq: Every day | ORAL | 4 refills | Status: DC

## 2020-10-19 MED ORDER — FERROUS SULFATE 325 (65 FE) MG PO TABS: 325.0000 mg | ORAL_TABLET | ORAL | 5 refills | Status: DC

## 2020-10-19 NOTE — Progress Notes (Signed)
Subjective:    Dawn Krause is being seen today for her first obstetrical visit.  This is not a planned pregnancy. She is at [redacted]w[redacted]d gestation. Her obstetrical history is significant for  anemia . Relationship with FOB: significant other, living together. Patient does intend to breast feed. Pregnancy history fully reviewed.  Had severe anemia with last pregnancy which was less than 1 year ago.  The information documented in the HPI was reviewed and verified.  Menstrual History: OB History     Gravida  3   Para  2   Term  2   Preterm      AB      Living  2      SAB      IAB      Ectopic      Multiple  0   Live Births  2            Patient's last menstrual period was 07/15/2020.    Past Medical History:  Diagnosis Date   Anemia    Infection    UTI    Past Surgical History:  Procedure Laterality Date   NO PAST SURGERIES      (Not in a hospital admission)  Allergies  Allergen Reactions   Gluten Meal Rash    Social History   Tobacco Use   Smoking status: Never   Smokeless tobacco: Never  Substance Use Topics   Alcohol use: Never    Family History  Problem Relation Age of Onset   Diabetes Mother    Hypertension Mother    Healthy Father      Review of Systems Constitutional: negative for weight loss Gastrointestinal: negative for vomiting Genitourinary:negative for genital lesions and vaginal discharge and dysuria Musculoskeletal:negative for back pain Behavioral/Psych: negative for abusive relationship, depression, illegal drug usage and tobacco use    Objective:    BP 116/82   Pulse (!) 107   Wt 113 lb (51.3 kg)   LMP 07/15/2020   BMI 20.67 kg/m  General Appearance:    Alert, cooperative, no distress, appears stated age  Head:    Normocephalic, without obvious abnormality, atraumatic  Eyes:    PERRL, conjunctiva/corneas clear, EOM's intact, fundi    benign, both eyes  Ears:    Normal TM's and external ear canals, both ears  Nose:    Nares normal, septum midline, mucosa normal, no drainage    or sinus tenderness  Throat:   Lips, mucosa, and tongue normal; teeth and gums normal  Neck:   Supple, symmetrical, trachea midline, no adenopathy;    thyroid:  no enlargement/tenderness/nodules; no carotid   bruit or JVD  Back:     Symmetric, no curvature, ROM normal, no CVA tenderness  Lungs:     Clear to auscultation bilaterally, respirations unlabored  Chest Wall:    No tenderness or deformity   Heart:    Regular rate and rhythm, S1 and S2 normal, no murmur, rub   or gallop  Breast Exam:    No tenderness, masses, or nipple abnormality  Abdomen:     Soft, non-tender, bowel sounds active all four quadrants,    no masses, no organomegaly  Genitalia:    Normal female without lesion, discharge or tenderness  Extremities:   Extremities normal, atraumatic, no cyanosis or edema  Pulses:   2+ and symmetric all extremities  Skin:   Skin color, texture, turgor normal, no rashes or lesions  Lymph nodes:   Cervical, supraclavicular, and axillary nodes  normal  Neurologic:   CNII-XII intact, normal strength, sensation and reflexes    throughout      Lab Review Urine pregnancy test Labs reviewed yes Radiologic studies reviewed yes  Assessment:    Pregnancy at [redacted]w[redacted]d weeks    Plan:   1. Supervision of other normal pregnancy, antepartum Rx: - Obstetric Panel, Including HIV - Culture, OB Urine - Genetic Screening - Cervicovaginal ancillary only( [place]) - Hepatitis C antibody - Cytology - PAP( [place]) - [place] MFM OB COMP + 14 WK; Future - Prenat-Fe Poly-Methfol-FA-DHA (VITAFOL ULTRA) 29-0.6-0.4-200 MG CAPS; Take 1 capsule by mouth daily before breakfast.  Dispense: 90 capsule; Refill: 4  2. Short interval between pregnancies affecting pregnancy, antepartum  3. Anemia affecting pregnancy, antepartum - history of severe anemia during last pregnancy less than 1 year ago Rx: - ferrous sulfate 325 (65 FE) MG tablet; Take 1  tablet (325 mg total) by mouth every other day.  Dispense: 60 tablet; Refill: 5 - Ferritin    Prenatal vitamins.  Counseling provided regarding continued use of seat belts, cessation of alcohol consumption, smoking or use of illicit drugs; infection precautions i.e., influenza/TDAP immunizations, toxoplasmosis,CMV, parvovirus, listeria and varicella; workplace safety, exercise during pregnancy; routine dental care, safe medications, sexual activity, hot tubs, saunas, pools, travel, caffeine use, fish and methlymercury, potential toxins, hair treatments, varicose veins Weight gain recommendations per IOM guidelines reviewed: underweight/BMI< 18.5--> gain 28 - 40 lbs; normal weight/BMI 18.5 - 24.9--> gain 25 - 35 lbs; overweight/BMI 25 - 29.9--> gain 15 - 25 lbs; obese/BMI >30->gain  11 - 20 lbs Problem list reviewed and updated. FIRST/CF mutation testing/NIPT/QUAD SCREEN/fragile X/Ashkenazi Jewish population testing/Spinal muscular atrophy discussed: requested. Role of ultrasound in pregnancy discussed; fetal survey: requested. Amniocentesis discussed: not indicated.   Orders Placed This Encounter  Procedures   Culture, OB Urine   Obstetric Panel, Including HIV   Genetic Screening    PANORAMA   Hepatitis C antibody    Follow up in 4 weeks.  I have spent a total of 20 minutes of face-to-face time, excluding clinical staff time, reviewing notes and preparing to see patient, ordering tests and/or medications, and counseling the patient.    Brock Bad, MD 10/19/2020 2:48 PM

## 2020-10-19 NOTE — Progress Notes (Signed)
New OB   New OB intake completed on 10/15/20  Genetic Screening:  Desires and would likt to know Gender. Last Pap: Never   [employer] Tablet offered.  CC: None

## 2020-10-20 LAB — OBSTETRIC PANEL, INCLUDING HIV
Antibody Screen: NEGATIVE
Basophils Absolute: 0 10*3/uL (ref 0.0–0.2)
Basos: 0 %
EOS (ABSOLUTE): 0.4 10*3/uL (ref 0.0–0.4)
Eos: 3 %
HIV Screen 4th Generation wRfx: NONREACTIVE
Hematocrit: 34.6 % (ref 34.0–46.6)
Hemoglobin: 11.4 g/dL (ref 11.1–15.9)
Hepatitis B Surface Ag: NEGATIVE
Immature Grans (Abs): 0 10*3/uL (ref 0.0–0.1)
Immature Granulocytes: 0 %
Lymphocytes Absolute: 1.9 10*3/uL (ref 0.7–3.1)
Lymphs: 18 %
MCH: 25.9 pg — ABNORMAL LOW (ref 26.6–33.0)
MCHC: 32.9 g/dL (ref 31.5–35.7)
MCV: 79 fL (ref 79–97)
Monocytes Absolute: 0.7 10*3/uL (ref 0.1–0.9)
Monocytes: 7 %
Neutrophils Absolute: 7.6 10*3/uL — ABNORMAL HIGH (ref 1.4–7.0)
Neutrophils: 72 %
Platelets: 235 10*3/uL (ref 150–450)
RBC: 4.41 x10E6/uL (ref 3.77–5.28)
RDW: 13.7 % (ref 11.7–15.4)
RPR Ser Ql: NONREACTIVE
Rh Factor: POSITIVE
Rubella Antibodies, IGG: 1.15 index (ref >0.99)
WBC: 10.7 10*3/uL (ref 3.4–10.8)

## 2020-10-20 LAB — HEPATITIS C ANTIBODY: Hep C Virus Ab: 0.1 s/co ratio (ref 0.0–0.9)

## 2020-10-20 LAB — CERVICOVAGINAL ANCILLARY ONLY
Bacterial Vaginitis (gardnerella): POSITIVE — AB
Candida Glabrata: NEGATIVE
Candida Vaginitis: NEGATIVE
Chlamydia: NEGATIVE
Comment: NEGATIVE
Comment: NEGATIVE
Comment: NEGATIVE
Comment: NEGATIVE
Comment: NEGATIVE
Comment: NORMAL
Neisseria Gonorrhea: NEGATIVE
Trichomonas: NEGATIVE

## 2020-10-20 LAB — FERRITIN: Ferritin: 8 ng/mL — ABNORMAL LOW (ref 15–150)

## 2020-10-21 ENCOUNTER — Other Ambulatory Visit: Payer: Self-pay | Admitting: Obstetrics

## 2020-10-21 DIAGNOSIS — N760 Acute vaginitis: Secondary | ICD-10-CM

## 2020-10-21 DIAGNOSIS — B9689 Other specified bacterial agents as the cause of diseases classified elsewhere: Secondary | ICD-10-CM

## 2020-10-21 LAB — CULTURE, OB URINE

## 2020-10-21 LAB — URINE CULTURE, OB REFLEX: Organism ID, Bacteria: NO GROWTH

## 2020-10-21 MED ORDER — METRONIDAZOLE 500 MG PO TABS: 500.0000 mg | ORAL_TABLET | Freq: Two times a day (BID) | ORAL | 2 refills | Status: DC

## 2020-10-22 LAB — CYTOLOGY - PAP
Diagnosis: NEGATIVE
Diagnosis: REACTIVE

## 2020-10-26 ENCOUNTER — Encounter: Payer: Self-pay | Admitting: Obstetrics

## 2020-10-27 ENCOUNTER — Encounter: Payer: Self-pay | Admitting: Obstetrics

## 2020-11-15 ENCOUNTER — Encounter: Payer: Self-pay | Admitting: Nurse Practitioner

## 2020-11-15 DIAGNOSIS — O09899 Supervision of other high risk pregnancies, unspecified trimester: Secondary | ICD-10-CM | POA: Insufficient documentation

## 2020-11-16 ENCOUNTER — Other Ambulatory Visit: Payer: Self-pay

## 2020-11-16 ENCOUNTER — Ambulatory Visit (INDEPENDENT_AMBULATORY_CARE_PROVIDER_SITE_OTHER): Payer: Medicaid Other | Admitting: Nurse Practitioner

## 2020-11-16 VITALS — BP 118/77 | HR 102 | Wt 115.6 lb

## 2020-11-16 DIAGNOSIS — Z3481 Encounter for supervision of other normal pregnancy, first trimester: Secondary | ICD-10-CM

## 2020-11-16 DIAGNOSIS — Z3A17 17 weeks gestation of pregnancy: Secondary | ICD-10-CM

## 2020-11-16 NOTE — Progress Notes (Signed)
    Subjective:  Dawn Krause is a 20 y.o. G3P2002 at [redacted]w[redacted]d being seen today for ongoing prenatal care.  She is currently monitored for the following issues for this low-risk pregnancy and has Anemia; Encounter for supervision of normal pregnancy in first trimester; and Short interval between pregnancies affecting pregnancy, antepartum on their problem list.  Patient reports no complaints.  Contractions: Not present. Vag. Bleeding: None.  Movement: Absent. Denies leaking of fluid.   The following portions of the patient's history were reviewed and updated as appropriate: allergies, current medications, past family history, past medical history, past social history, past surgical history and problem list. Problem list updated.  Objective:   Vitals:   11/16/20 1314  BP: 118/77  Pulse: (!) 102  Weight: 115 lb 9.6 oz (52.4 kg)    Fetal Status: Fetal Heart Rate (bpm): 143 Fundal Height: 17 cm Movement: Absent     General:  Alert, oriented and cooperative. Patient is in no acute distress.  Skin: Skin is warm and dry. No rash noted.   Cardiovascular: Normal heart rate noted  Respiratory: Normal respiratory effort, no problems with respiration noted  Abdomen: Soft, gravid, appropriate for gestational age. Pain/Pressure: Absent     Pelvic:  Cervical exam deferred        Extremities: Normal range of motion.  Edema: None  Mental Status: Normal mood and affect. Normal behavior. Normal judgment and thought content.   Urinalysis:      Assessment and Plan:  Pregnancy: G3P2002 at [redacted]w[redacted]d  1. Encounter for supervision of other normal pregnancy in first trimester Doing well - wants some kind of birth control as she does not want another child soon. Has never used LARC and does not do well taking pills.  Discussed IUD briefly and info added to AVS for her to consider. Reviewed [place] next week for anatomy  - AFP, Serum, Open Spina Bifida  Preterm labor symptoms and general obstetric precautions  including but not limited to vaginal bleeding, contractions, leaking of fluid and fetal movement were reviewed in detail with the patient. Please refer to After Visit Summary for other counseling recommendations.  Return in about 4 weeks (around 12/14/2020) for in person ROB.  Nolene Bernheim, RN, MSN, NP-BC Nurse Practitioner, [facility] for [employer], [facility] 11/16/2020 1:24 PM

## 2020-11-18 LAB — AFP, SERUM, OPEN SPINA BIFIDA
AFP MoM: 1.03
AFP Value: 52.6 ng/mL
Gest. Age on Collection Date: 17.5 weeks
Maternal Age At EDD: 21.8 yr
OSBR Risk 1 IN: 10000
Test Results:: NEGATIVE
Weight: 115 [lb_av]

## 2020-11-22 ENCOUNTER — Other Ambulatory Visit: Payer: Self-pay

## 2020-11-22 ENCOUNTER — Ambulatory Visit: Payer: Medicaid Other | Attending: Obstetrics

## 2020-11-22 ENCOUNTER — Other Ambulatory Visit: Payer: Self-pay | Admitting: Obstetrics

## 2020-11-22 ENCOUNTER — Other Ambulatory Visit: Payer: Self-pay | Admitting: *Deleted

## 2020-11-22 DIAGNOSIS — Z3480 Encounter for supervision of other normal pregnancy, unspecified trimester: Secondary | ICD-10-CM | POA: Insufficient documentation

## 2020-11-22 DIAGNOSIS — Z362 Encounter for other antenatal screening follow-up: Secondary | ICD-10-CM

## 2020-12-14 ENCOUNTER — Encounter: Payer: Medicaid Other | Admitting: Obstetrics and Gynecology

## 2020-12-15 ENCOUNTER — Ambulatory Visit (INDEPENDENT_AMBULATORY_CARE_PROVIDER_SITE_OTHER): Payer: Medicaid Other | Admitting: Obstetrics and Gynecology

## 2020-12-15 ENCOUNTER — Other Ambulatory Visit: Payer: Self-pay

## 2020-12-15 DIAGNOSIS — Z3481 Encounter for supervision of other normal pregnancy, first trimester: Secondary | ICD-10-CM

## 2020-12-15 DIAGNOSIS — Z3A21 21 weeks gestation of pregnancy: Secondary | ICD-10-CM | POA: Insufficient documentation

## 2020-12-15 NOTE — Progress Notes (Signed)
Pt reports fetal movement, denies pain.  

## 2020-12-15 NOTE — Progress Notes (Signed)
   PRENATAL VISIT NOTE  Subjective:  Dawn Krause is a 20 y.o. G3P2002 at [redacted]w[redacted]d being seen today for ongoing prenatal care.  She is currently monitored for the following issues for this low-risk pregnancy and has Anemia; Encounter for supervision of normal pregnancy in first trimester; Short interval between pregnancies affecting pregnancy, antepartum; and [redacted] weeks gestation of pregnancy on their problem list.  Patient doing well with no acute concerns today. She reports no complaints.  Contractions: Not present. Vag. Bleeding: None.  Movement: Present. Denies leaking of fluid.   The following portions of the patient's history were reviewed and updated as appropriate: allergies, current medications, past family history, past medical history, past social history, past surgical history and problem list. Problem list updated.  Objective:   Vitals:   12/15/20 1418  BP: 114/74  Pulse: 91  Weight: 122 lb 4.8 oz (55.5 kg)    Fetal Status: Fetal Heart Rate (bpm): 145 Fundal Height: 21 cm Movement: Present     General:  Alert, oriented and cooperative. Patient is in no acute distress.  Skin: Skin is warm and dry. No rash noted.   Cardiovascular: Normal heart rate noted  Respiratory: Normal respiratory effort, no problems with respiration noted  Abdomen: Soft, gravid, appropriate for gestational age.  Pain/Pressure: Absent     Pelvic: Cervical exam deferred        Extremities: Normal range of motion.  Edema: None  Mental Status:  Normal mood and affect. Normal behavior. Normal judgment and thought content.   Assessment and Plan:  Pregnancy: G3P2002 at [redacted]w[redacted]d  1. Encounter for supervision of other normal pregnancy in first trimester Continue routine care, information given on nexplanon  2. [redacted] weeks gestation of pregnancy   Preterm labor symptoms and general obstetric precautions including but not limited to vaginal bleeding, contractions, leaking of fluid and fetal movement were reviewed  in detail with the patient.  Please refer to After Visit Summary for other counseling recommendations.   Return in about 4 weeks (around 01/12/2021) for ROB, in person.   Mariel Aloe, MD Faculty Attending Center for [facility]

## 2020-12-21 ENCOUNTER — Ambulatory Visit: Payer: Medicaid Other | Admitting: *Deleted

## 2020-12-21 ENCOUNTER — Ambulatory Visit: Payer: Medicaid Other | Attending: Obstetrics

## 2020-12-21 ENCOUNTER — Other Ambulatory Visit: Payer: Self-pay

## 2020-12-21 ENCOUNTER — Encounter: Payer: Self-pay | Admitting: *Deleted

## 2020-12-21 VITALS — BP 119/57 | HR 83

## 2020-12-21 DIAGNOSIS — Z362 Encounter for other antenatal screening follow-up: Secondary | ICD-10-CM | POA: Diagnosis not present

## 2020-12-21 DIAGNOSIS — O09899 Supervision of other high risk pregnancies, unspecified trimester: Secondary | ICD-10-CM | POA: Diagnosis present

## 2021-01-12 ENCOUNTER — Encounter: Payer: Medicaid Other | Admitting: Women's Health

## 2021-02-17 ENCOUNTER — Ambulatory Visit (INDEPENDENT_AMBULATORY_CARE_PROVIDER_SITE_OTHER): Payer: Medicaid Other | Admitting: Obstetrics and Gynecology

## 2021-02-17 ENCOUNTER — Encounter: Payer: Self-pay | Admitting: Obstetrics and Gynecology

## 2021-02-17 ENCOUNTER — Other Ambulatory Visit: Payer: Self-pay

## 2021-02-17 ENCOUNTER — Other Ambulatory Visit: Payer: Medicaid Other

## 2021-02-17 VITALS — BP 120/76 | HR 102 | Wt 133.0 lb

## 2021-02-17 DIAGNOSIS — Z3481 Encounter for supervision of other normal pregnancy, first trimester: Secondary | ICD-10-CM

## 2021-02-17 DIAGNOSIS — O09899 Supervision of other high risk pregnancies, unspecified trimester: Secondary | ICD-10-CM

## 2021-02-17 NOTE — Progress Notes (Signed)
   PRENATAL VISIT NOTE  Subjective:  Dawn Krause is a 20 y.o. G3P2002 at [redacted]w[redacted]d being seen today for ongoing prenatal care.  She is currently monitored for the following issues for this low-risk pregnancy and has Anemia; Encounter for supervision of normal pregnancy in first trimester; and Short interval between pregnancies affecting pregnancy, antepartum on their problem list.  Patient reports no complaints.  Contractions: Not present. Vag. Bleeding: None.  Movement: Present. Denies leaking of fluid.   The following portions of the patient's history were reviewed and updated as appropriate: allergies, current medications, past family history, past medical history, past social history, past surgical history and problem list.   Objective:   Vitals:   02/17/21 0931  BP: 120/76  Pulse: (!) 102  Weight: 133 lb (60.3 kg)    Fetal Status: Fetal Heart Rate (bpm): 140 Fundal Height: 30 cm Movement: Present     General:  Alert, oriented and cooperative. Patient is in no acute distress.  Skin: Skin is warm and dry. No rash noted.   Cardiovascular: Normal heart rate noted  Respiratory: Normal respiratory effort, no problems with respiration noted  Abdomen: Soft, gravid, appropriate for gestational age.  Pain/Pressure: Absent     Pelvic: Cervical exam deferred        Extremities: Normal range of motion.     Mental Status: Normal mood and affect. Normal behavior. Normal judgment and thought content.   Assessment and Plan:  Pregnancy: G3P2002 at [redacted]w[redacted]d 1. Encounter for supervision of other normal pregnancy in first trimester Patient is doing well without complaints Third trimester labs today  2. Short interval between pregnancies affecting pregnancy, antepartum   Preterm labor symptoms and general obstetric precautions including but not limited to vaginal bleeding, contractions, leaking of fluid and fetal movement were reviewed in detail with the patient. Please refer to After Visit Summary  for other counseling recommendations.   Return in about 2 weeks (around 03/03/2021) for in person, ROB, Low risk.  Future Appointments  Date Time Provider Department Center  02/17/2021  9:55 AM Brittni Hult, Gigi Gin, MD [facility] None    Catalina Antigua, MD

## 2021-02-18 LAB — RPR: RPR Ser Ql: NONREACTIVE

## 2021-02-18 LAB — CBC
Hematocrit: 29.3 % — ABNORMAL LOW (ref 34.0–46.6)
Hemoglobin: 9.5 g/dL — ABNORMAL LOW (ref 11.1–15.9)
MCH: 24.5 pg — ABNORMAL LOW (ref 26.6–33.0)
MCHC: 32.4 g/dL (ref 31.5–35.7)
MCV: 76 fL — ABNORMAL LOW (ref 79–97)
Platelets: 168 10*3/uL (ref 150–450)
RBC: 3.87 x10E6/uL (ref 3.77–5.28)
RDW: 13.9 % (ref 11.7–15.4)
WBC: 9.4 10*3/uL (ref 3.4–10.8)

## 2021-02-18 LAB — GLUCOSE TOLERANCE, 2 HOURS W/ 1HR
Glucose, 1 hour: 141 mg/dL (ref 70–179)
Glucose, 2 hour: 111 mg/dL (ref 70–152)
Glucose, Fasting: 83 mg/dL (ref 70–91)

## 2021-02-18 LAB — HIV ANTIBODY (ROUTINE TESTING W REFLEX): HIV Screen 4th Generation wRfx: NONREACTIVE

## 2021-02-21 NOTE — Addendum Note (Signed)
Addended by: Catalina Antigua on: 02/21/2021 10:43 AM   Modules accepted: Orders, SmartSet

## 2021-02-23 ENCOUNTER — Encounter: Payer: Self-pay | Admitting: *Deleted

## 2021-02-23 ENCOUNTER — Other Ambulatory Visit: Payer: Self-pay | Admitting: *Deleted

## 2021-02-23 NOTE — Progress Notes (Signed)
Patient is active in MyChart. Message sent regarding anemia, iron infusion order and follow up. Information on anemia in pregnancy included.

## 2021-03-15 ENCOUNTER — Telehealth: Payer: Self-pay

## 2021-03-15 NOTE — Telephone Encounter (Signed)
Breast pump order signed and faxed back to [employer].  Confirmation received. 

## 2021-11-10 ENCOUNTER — Ambulatory Visit: Payer: Medicaid Other | Admitting: Obstetrics & Gynecology

## 2022-01-11 ENCOUNTER — Other Ambulatory Visit (HOSPITAL_COMMUNITY)
Admission: RE | Admit: 2022-01-11 | Discharge: 2022-01-11 | Disposition: A | Discharge status: Home / Self Care | Payer: Medicaid Other | Source: Ambulatory Visit | Attending: Obstetrics and Gynecology | Admitting: Obstetrics and Gynecology

## 2022-01-11 ENCOUNTER — Encounter: Payer: Self-pay | Admitting: Obstetrics and Gynecology

## 2022-01-11 ENCOUNTER — Ambulatory Visit (INDEPENDENT_AMBULATORY_CARE_PROVIDER_SITE_OTHER): Payer: Medicaid Other | Admitting: Obstetrics and Gynecology

## 2022-01-11 VITALS — BP 138/82 | HR 103 | Wt 122.0 lb

## 2022-01-11 DIAGNOSIS — Z3A17 17 weeks gestation of pregnancy: Secondary | ICD-10-CM | POA: Diagnosis not present

## 2022-01-11 DIAGNOSIS — Z3482 Encounter for supervision of other normal pregnancy, second trimester: Secondary | ICD-10-CM

## 2022-01-11 DIAGNOSIS — Z3480 Encounter for supervision of other normal pregnancy, unspecified trimester: Secondary | ICD-10-CM | POA: Insufficient documentation

## 2022-01-11 DIAGNOSIS — O09892 Supervision of other high risk pregnancies, second trimester: Secondary | ICD-10-CM

## 2022-01-11 DIAGNOSIS — O09899 Supervision of other high risk pregnancies, unspecified trimester: Secondary | ICD-10-CM

## 2022-01-11 MED ORDER — VITAFOL ULTRA 29-0.6-0.4-200 MG PO CAPS: 1.0000 | ORAL_CAPSULE | Freq: Every day | ORAL | 4 refills | Status: DC

## 2022-01-11 NOTE — Progress Notes (Signed)
INITIAL PRENATAL VISIT NOTE  Subjective:  Dawn Krause is a 21 y.o. G4P3003 at [redacted]w[redacted]d by approximate LMP being seen today for her initial prenatal visit. She has an obstetric history significant for SVD x 3. She has an uncomplicated medical history.  Patient reports no complaints.  Contractions: Not present. Vag. Bleeding: None.  Movement: Present. Denies leaking of fluid.    Past Medical History:  Diagnosis Date   Anemia    Infection    UTI    Past Surgical History:  Procedure Laterality Date   NO PAST SURGERIES      OB History  Gravida Para Term Preterm AB Living  $Remov'4 3 3     3  'aNNUwZ$ SAB IAB Ectopic Multiple Live Births        0 3    # Outcome Date GA Lbr Len/2nd Weight Sex Delivery Anes PTL Lv  4 Current           3 Term 04/15/21 [redacted]w[redacted]d    Vag-Spont   LIV  2 Term 03/16/20 [redacted]w[redacted]d 08:36 / 00:09 7 lb 2.5 oz (3.246 kg) M Vag-Spont EPI  LIV  1 Term 12/15/18 [redacted]w[redacted]d   F Vag-Spont  N LIV    Social History   Socioeconomic History   Marital status: Single    Spouse name: Not on file   Number of children: Not on file   Years of education: Not on file   Highest education level: Not on file  Occupational History   Not on file  Tobacco Use   Smoking status: Never   Smokeless tobacco: Never  Vaping Use   Vaping Use: Never used  Substance and Sexual Activity   Alcohol use: Not Currently   Drug use: Not Currently   Sexual activity: Not Currently  Other Topics Concern   Not on file  Social History Narrative   Not on file   Social Determinants of Health   Financial Resource Strain: Not on file  Food Insecurity: Not on file  Transportation Needs: Not on file  Physical Activity: Not on file  Stress: Not on file  Social Connections: Not on file    Family History  Problem Relation Age of Onset   Diabetes Mother    Hypertension Mother    Healthy Father      Current Outpatient Medications:    acetaminophen (TYLENOL) 325 MG tablet, Take 2 tablets (650 mg total) by mouth  every 6 (six) hours as needed for mild pain, moderate pain, fever or headache., Disp: , Rfl:    Blood Pressure Monitoring (BLOOD PRESSURE KIT) DEVI, 1 kit by Does not apply route once a week., Disp: 1 each, Rfl: 0   Misc. Devices (GOJJI WEIGHT SCALE) MISC, 1 Device by Does not apply route every 30 (thirty) days., Disp: 1 each, Rfl: 0   Prenat-Fe Poly-Methfol-FA-DHA (VITAFOL ULTRA) 29-0.6-0.4-200 MG CAPS, Take 1 capsule by mouth daily before breakfast., Disp: 90 capsule, Rfl: 4  Allergies  Allergen Reactions   Gluten Meal Rash    Review of Systems: Negative except for what is mentioned in HPI.  Objective:   Vitals:   01/11/22 1504  BP: 138/82  Pulse: (!) 103  Weight: 122 lb (55.3 kg)    Fetal Status: Fetal Heart Rate (bpm): 150 Fundal Height: 17 cm Movement: Present     Physical Exam: BP 138/82   Pulse (!) 103   Wt 122 lb (55.3 kg)   LMP 09/10/2021   BMI 22.31 kg/m  CONSTITUTIONAL: Well-developed, well-nourished  female in no acute distress.  NEUROLOGIC: Alert and oriented to person, place, and time. Normal reflexes, muscle tone coordination. No cranial nerve deficit noted. PSYCHIATRIC: Normal mood and affect. Normal behavior. Normal judgment and thought content. SKIN: Skin is warm and dry. No rash noted. Not diaphoretic. No erythema. No pallor. HENT:  Normocephalic, atraumatic, External right and left ear normal. Oropharynx is clear and moist EYES: Conjunctivae and EOM are normal. NECK: Normal range of motion, supple, no masses CARDIOVASCULAR: Normal heart rate noted, regular rhythm RESPIRATORY: Effort and breath sounds normal, no problems with respiration noted BREASTS: deferred ABDOMEN: Soft, nontender, nondistended, gravid. ZO:XWRUEAVW MUSCULOSKELETAL: Normal range of motion. EXT:  No edema and no tenderness. 2+ distal pulses.   Assessment and Plan:  Pregnancy: G4P3003 at [redacted]w[redacted]d by LMP  1. Supervision of other normal pregnancy, antepartum Continue routine prenatal  care Pt [facility] she is asked to bring the printout to compare for dating and EDC.  - Cervicovaginal ancillary only( [place]) - CBC/D/Plt+RPR+Rh+ABO+RubIgG... - Culture, OB Urine - Panorama Prenatal Test Full Panel - [place] MFM OB COMP + 43 WK; Future - Prenat-Fe Poly-Methfol-FA-DHA (VITAFOL ULTRA) 29-0.6-0.4-200 MG CAPS; Take 1 capsule by mouth daily before breakfast.  Dispense: 90 capsule; Refill: 4  2. Short interval between pregnancies affecting pregnancy, antepartum Last delivery 11/21   Preterm labor symptoms and general obstetric precautions including but not limited to vaginal bleeding, contractions, leaking of fluid and fetal movement were reviewed in detail with the patient.  Please refer to After Visit Summary for other counseling recommendations.   Return in about 4 weeks (around 02/08/2022) for ROB, in person.  Griffin Basil 01/11/2022 3:41 PM

## 2022-01-11 NOTE — Progress Notes (Signed)
[facility] March 2024.  Pt does not have record of that today.

## 2022-01-11 NOTE — Addendum Note (Signed)
Addended by: Marya Landry D on: 01/11/2022 05:06 PM   Modules accepted: Orders

## 2022-01-12 LAB — CBC/D/PLT+RPR+RH+ABO+RUBIGG...
Antibody Screen: NEGATIVE
Basophils Absolute: 0.1 10*3/uL (ref 0.0–0.2)
Basos: 1 %
EOS (ABSOLUTE): 0.4 10*3/uL (ref 0.0–0.4)
Eos: 5 %
HCV Ab: NONREACTIVE
HIV Screen 4th Generation wRfx: NONREACTIVE
Hematocrit: 32.9 % — ABNORMAL LOW (ref 34.0–46.6)
Hemoglobin: 10.9 g/dL — ABNORMAL LOW (ref 11.1–15.9)
Hepatitis B Surface Ag: NEGATIVE
Immature Grans (Abs): 0 10*3/uL (ref 0.0–0.1)
Immature Granulocytes: 0 %
Lymphocytes Absolute: 2.3 10*3/uL (ref 0.7–3.1)
Lymphs: 23 %
MCH: 25.7 pg — ABNORMAL LOW (ref 26.6–33.0)
MCHC: 33.1 g/dL (ref 31.5–35.7)
MCV: 78 fL — ABNORMAL LOW (ref 79–97)
Monocytes Absolute: 0.6 10*3/uL (ref 0.1–0.9)
Monocytes: 6 %
Neutrophils Absolute: 6.5 10*3/uL (ref 1.4–7.0)
Neutrophils: 65 %
Platelets: 245 10*3/uL (ref 150–450)
RBC: 4.24 x10E6/uL (ref 3.77–5.28)
RDW: 16.1 % — ABNORMAL HIGH (ref 11.7–15.4)
RPR Ser Ql: NONREACTIVE
Rh Factor: POSITIVE
Rubella Antibodies, IGG: 2.04 index (ref >0.99)
WBC: 9.9 10*3/uL (ref 3.4–10.8)

## 2022-01-12 LAB — HCV INTERPRETATION

## 2022-01-13 LAB — CULTURE, OB URINE

## 2022-01-13 LAB — CERVICOVAGINAL ANCILLARY ONLY
Chlamydia: NEGATIVE
Comment: NEGATIVE
Comment: NEGATIVE
Comment: NORMAL
Neisseria Gonorrhea: NEGATIVE
Trichomonas: NEGATIVE

## 2022-01-13 LAB — URINE CULTURE, OB REFLEX

## 2022-01-15 LAB — AFP, SERUM, OPEN SPINA BIFIDA
AFP MoM: 0.47
AFP Value: 23 ng/mL
Gest. Age on Collection Date: 17.6 weeks
Maternal Age At EDD: 23 yr
OSBR Risk 1 IN: 10000
Test Results:: NEGATIVE
Weight: 122 [lb_av]

## 2022-01-17 LAB — PANORAMA PRENATAL TEST FULL PANEL:PANORAMA TEST PLUS 5 ADDITIONAL MICRODELETIONS: FETAL FRACTION: 9.3

## 2022-01-30 ENCOUNTER — Encounter: Payer: Self-pay | Admitting: Obstetrics and Gynecology

## 2022-02-08 ENCOUNTER — Encounter: Payer: Self-pay | Admitting: Obstetrics and Gynecology

## 2022-02-08 ENCOUNTER — Ambulatory Visit (INDEPENDENT_AMBULATORY_CARE_PROVIDER_SITE_OTHER): Payer: Medicaid Other | Admitting: Obstetrics and Gynecology

## 2022-02-08 VITALS — BP 113/76 | HR 78 | Wt 128.0 lb

## 2022-02-08 DIAGNOSIS — Z3480 Encounter for supervision of other normal pregnancy, unspecified trimester: Secondary | ICD-10-CM

## 2022-02-08 DIAGNOSIS — Z3A21 21 weeks gestation of pregnancy: Secondary | ICD-10-CM

## 2022-02-08 DIAGNOSIS — Z3482 Encounter for supervision of other normal pregnancy, second trimester: Secondary | ICD-10-CM

## 2022-02-08 NOTE — Progress Notes (Signed)
Subjective:  Dawn Krause is a 21 y.o. G4P3003 at [redacted]w[redacted]d being seen today for ongoing prenatal care.  She is currently monitored for the following issues for this low-risk pregnancy and has Anemia; Short interval between pregnancies affecting pregnancy, antepartum; and Supervision of other normal pregnancy, antepartum on their problem list.  Patient reports no complaints.  Contractions: Not present. Vag. Bleeding: None.  Movement: Present. Denies leaking of fluid.   The following portions of the patient's history were reviewed and updated as appropriate: allergies, current medications, past family history, past medical history, past social history, past surgical history and problem list. Problem list updated.  Objective:   Vitals:   02/08/22 1403  BP: 113/76  Pulse: 78  Weight: 128 lb (58.1 kg)    Fetal Status: Fetal Heart Rate (bpm): 140   Movement: Present     General:  Alert, oriented and cooperative. Patient is in no acute distress.  Skin: Skin is warm and dry. No rash noted.   Cardiovascular: Normal heart rate noted  Respiratory: Normal respiratory effort, no problems with respiration noted  Abdomen: Soft, gravid, appropriate for gestational age. Pain/Pressure: Absent     Pelvic:  Cervical exam deferred        Extremities: Normal range of motion.     Mental Status: Normal mood and affect. Normal behavior. Normal judgment and thought content.   Urinalysis:      Assessment and Plan:  Pregnancy: G4P3003 at [redacted]w[redacted]d  1. Supervision of other normal pregnancy, antepartum Stable Anatomy scan tomorrow  Preterm labor symptoms and general obstetric precautions including but not limited to vaginal bleeding, contractions, leaking of fluid and fetal movement were reviewed in detail with the patient. Please refer to After Visit Summary for other counseling recommendations.  Return in about 4 weeks (around 03/08/2022) for OB visit, face to face, any provider.   Chancy Milroy, MD

## 2022-02-08 NOTE — Patient Instructions (Signed)

## 2022-02-09 ENCOUNTER — Ambulatory Visit: Payer: Medicaid Other | Attending: Obstetrics and Gynecology

## 2022-02-09 DIAGNOSIS — Z3480 Encounter for supervision of other normal pregnancy, unspecified trimester: Secondary | ICD-10-CM | POA: Insufficient documentation

## 2022-02-09 DIAGNOSIS — Z3687 Encounter for antenatal screening for uncertain dates: Secondary | ICD-10-CM | POA: Diagnosis not present

## 2022-02-09 DIAGNOSIS — Z363 Encounter for antenatal screening for malformations: Secondary | ICD-10-CM | POA: Insufficient documentation

## 2022-02-09 DIAGNOSIS — Z3A19 19 weeks gestation of pregnancy: Secondary | ICD-10-CM | POA: Diagnosis not present

## 2022-03-08 ENCOUNTER — Encounter: Payer: Self-pay | Admitting: Obstetrics and Gynecology

## 2022-03-08 ENCOUNTER — Ambulatory Visit (INDEPENDENT_AMBULATORY_CARE_PROVIDER_SITE_OTHER): Payer: Medicaid Other | Admitting: Obstetrics and Gynecology

## 2022-03-08 VITALS — BP 115/79 | HR 96 | Wt 130.6 lb

## 2022-03-08 DIAGNOSIS — Z3482 Encounter for supervision of other normal pregnancy, second trimester: Secondary | ICD-10-CM

## 2022-03-08 DIAGNOSIS — Z3480 Encounter for supervision of other normal pregnancy, unspecified trimester: Secondary | ICD-10-CM

## 2022-03-08 DIAGNOSIS — Z3A23 23 weeks gestation of pregnancy: Secondary | ICD-10-CM

## 2022-03-08 NOTE — Progress Notes (Signed)
Subjective:  Dawn Krause is a 21 y.o. G4P3003 at 102w0d being seen today for ongoing prenatal care.  She is currently monitored for the following issues for this low-risk pregnancy and has Anemia; Short interval between pregnancies affecting pregnancy, antepartum; and Supervision of other normal pregnancy, antepartum on their problem list.  Patient reports no complaints.  Contractions: Not present. Vag. Bleeding: None.  Movement: Present. Denies leaking of fluid.   The following portions of the patient's history were reviewed and updated as appropriate: allergies, current medications, past family history, past medical history, past social history, past surgical history and problem list. Problem list updated.  Objective:   Vitals:   03/08/22 1403  BP: 115/79  Pulse: 96  Weight: 130 lb 9.6 oz (59.2 kg)    Fetal Status: Fetal Heart Rate (bpm): 129   Movement: Present     General:  Alert, oriented and cooperative. Patient is in no acute distress.  Skin: Skin is warm and dry. No rash noted.   Cardiovascular: Normal heart rate noted  Respiratory: Normal respiratory effort, no problems with respiration noted  Abdomen: Soft, gravid, appropriate for gestational age. Pain/Pressure: Absent     Pelvic:  Cervical exam deferred        Extremities: Normal range of motion.  Edema: None  Mental Status: Normal mood and affect. Normal behavior. Normal judgment and thought content.   Urinalysis:      Assessment and Plan:  Pregnancy: G4P3003 at [redacted]w[redacted]d  1. Supervision of other normal pregnancy, antepartum Stable Glucola next visit  Preterm labor symptoms and general obstetric precautions including but not limited to vaginal bleeding, contractions, leaking of fluid and fetal movement were reviewed in detail with the patient. Please refer to After Visit Summary for other counseling recommendations.  Return in about 4 weeks (around 04/05/2022) for OB visit, face to face, any provider, fasting for  Glucola.   Hermina Staggers, MD

## 2022-03-08 NOTE — Patient Instructions (Signed)

## 2022-04-07 ENCOUNTER — Ambulatory Visit (INDEPENDENT_AMBULATORY_CARE_PROVIDER_SITE_OTHER): Payer: Medicaid Other | Admitting: Obstetrics and Gynecology

## 2022-04-07 ENCOUNTER — Other Ambulatory Visit: Payer: Medicaid Other

## 2022-04-07 ENCOUNTER — Encounter: Payer: Self-pay | Admitting: Obstetrics and Gynecology

## 2022-04-07 VITALS — BP 117/81 | HR 105 | Wt 136.0 lb

## 2022-04-07 DIAGNOSIS — Z3A27 27 weeks gestation of pregnancy: Secondary | ICD-10-CM

## 2022-04-07 DIAGNOSIS — Z3483 Encounter for supervision of other normal pregnancy, third trimester: Secondary | ICD-10-CM

## 2022-04-07 DIAGNOSIS — Z3480 Encounter for supervision of other normal pregnancy, unspecified trimester: Secondary | ICD-10-CM

## 2022-04-07 DIAGNOSIS — O219 Vomiting of pregnancy, unspecified: Secondary | ICD-10-CM | POA: Insufficient documentation

## 2022-04-07 MED ORDER — ONDANSETRON HCL 4 MG PO TABS: 4.0000 mg | ORAL_TABLET | Freq: Three times a day (TID) | ORAL | 2 refills | Status: DC | PRN

## 2022-04-07 NOTE — Patient Instructions (Signed)

## 2022-04-07 NOTE — Progress Notes (Signed)
Subjective:  Dawn Krause is a 21 y.o. G4P3003 at [redacted]w[redacted]d being seen today for ongoing prenatal care.  She is currently monitored for the following issues for this low-risk pregnancy and has Anemia; Short interval between pregnancies affecting pregnancy, antepartum; Supervision of other normal pregnancy, antepartum; and Nausea and vomiting during pregnancy on their problem list.  Patient reports nausea.  Contractions: Not present. Vag. Bleeding: None.  Movement: Present. Denies leaking of fluid.   The following portions of the patient's history were reviewed and updated as appropriate: allergies, current medications, past family history, past medical history, past social history, past surgical history and problem list. Problem list updated.  Objective:   Vitals:   04/07/22 0940  BP: 117/81  Pulse: (!) 105  Weight: 136 lb (61.7 kg)    Fetal Status: Fetal Heart Rate (bpm): 140 Fundal Height: 76 cm Movement: Present     General:  Alert, oriented and cooperative. Patient is in no acute distress.  Skin: Skin is warm and dry. No rash noted.   Cardiovascular: Normal heart rate noted  Respiratory: Normal respiratory effort, no problems with respiration noted  Abdomen: Soft, gravid, appropriate for gestational age. Pain/Pressure: Absent     Pelvic:  Cervical exam deferred        Extremities: Normal range of motion.  Edema: Trace  Mental Status: Normal mood and affect. Normal behavior. Normal judgment and thought content.   Urinalysis:      Assessment and Plan:  Pregnancy: G4P3003 at [redacted]w[redacted]d  1. Supervision of other normal pregnancy, antepartum Stable - Glucose Tolerance, 2 Hours w/1 Hour - RPR - HIV antibody (with reflex) - CBC Discussed IUD for contraception Declined Dtap 2. Nausea and vomiting during pregnancy  - ondansetron (ZOFRAN) 4 MG tablet; Take 1 tablet (4 mg total) by mouth every 8 (eight) hours as needed for nausea or vomiting.  Dispense: 20 tablet; Refill: 2  Preterm labor  symptoms and general obstetric precautions including but not limited to vaginal bleeding, contractions, leaking of fluid and fetal movement were reviewed in detail with the patient. Please refer to After Visit Summary for other counseling recommendations.  Return in about 2 weeks (around 04/21/2022) for OB visit, face to face, any provider.   Hermina Staggers, MD

## 2022-04-07 NOTE — Progress Notes (Signed)
Pt reports fetal movement, denies pain. Pt states that she needs rx for nausea and vomiting. Declined tdap

## 2022-04-08 LAB — GLUCOSE TOLERANCE, 2 HOURS W/ 1HR
Glucose, 1 hour: 124 mg/dL (ref 70–179)
Glucose, 2 hour: 109 mg/dL (ref 70–152)
Glucose, Fasting: 80 mg/dL (ref 70–91)

## 2022-04-08 LAB — CBC
Hematocrit: 30 % — ABNORMAL LOW (ref 34.0–46.6)
Hemoglobin: 9.3 g/dL — ABNORMAL LOW (ref 11.1–15.9)
MCH: 23 pg — ABNORMAL LOW (ref 26.6–33.0)
MCHC: 31 g/dL — ABNORMAL LOW (ref 31.5–35.7)
MCV: 74 fL — ABNORMAL LOW (ref 79–97)
Platelets: 186 10*3/uL (ref 150–450)
RBC: 4.05 x10E6/uL (ref 3.77–5.28)
RDW: 14.5 % (ref 11.7–15.4)
WBC: 8.8 10*3/uL (ref 3.4–10.8)

## 2022-04-08 LAB — HIV ANTIBODY (ROUTINE TESTING W REFLEX): HIV Screen 4th Generation wRfx: NONREACTIVE

## 2022-04-08 LAB — RPR: RPR Ser Ql: NONREACTIVE

## 2022-04-14 ENCOUNTER — Other Ambulatory Visit: Payer: Self-pay | Admitting: Emergency Medicine

## 2022-04-14 MED ORDER — FERROUS SULFATE 325 (65 FE) MG PO TABS: 325.0000 mg | ORAL_TABLET | ORAL | 0 refills | Status: DC

## 2022-04-14 NOTE — Progress Notes (Signed)
Rx for Ferrous Sulfate.

## 2022-04-21 ENCOUNTER — Encounter: Payer: Medicaid Other | Admitting: Obstetrics

## 2022-05-01 NOTE — L&D Delivery Note (Signed)
Labor and Delivery Course Patient presented to [facility] via [facility] from [facility]. Bleeding and concern for SROM assessed on arrival to MAU. She endorsed worsening contraction pain shortly after arrival. NICU notified of admission. I assisted with transporting patient to L&D. On arrival to 2S16 she reported strong urge to push. Perineal bulging observed. Fetal head delivered shortly thereafter.  No nuchal cord present. Shoulder and body delivered in usual fashion. At 1341 a viable female was delivered via Vaginal, Spontaneous (Presentation: LOT; LOA).  Infant with spontaneous cry, placed on mother's abdomen, dried and stimulated.   Care assumed by Dr. Ernestina Patches who managed delivery of placenta and perineal assessment.  Mallie Snooks, [place], MSN, CNM Certified Nurse Midwife, Product/process development scientist for [employer], [place]

## 2022-05-02 ENCOUNTER — Encounter: Payer: Medicaid Other | Admitting: Student

## 2022-05-03 ENCOUNTER — Ambulatory Visit (INDEPENDENT_AMBULATORY_CARE_PROVIDER_SITE_OTHER): Payer: Medicaid Other | Admitting: Obstetrics

## 2022-05-03 ENCOUNTER — Encounter: Payer: Self-pay | Admitting: Obstetrics

## 2022-05-03 VITALS — BP 126/70 | HR 95 | Wt 140.8 lb

## 2022-05-03 DIAGNOSIS — Z1332 Encounter for screening for maternal depression: Secondary | ICD-10-CM

## 2022-05-03 DIAGNOSIS — O99013 Anemia complicating pregnancy, third trimester: Secondary | ICD-10-CM

## 2022-05-03 DIAGNOSIS — Z3A31 31 weeks gestation of pregnancy: Secondary | ICD-10-CM

## 2022-05-03 DIAGNOSIS — Z3483 Encounter for supervision of other normal pregnancy, third trimester: Secondary | ICD-10-CM

## 2022-05-03 DIAGNOSIS — O09899 Supervision of other high risk pregnancies, unspecified trimester: Secondary | ICD-10-CM

## 2022-05-03 DIAGNOSIS — Z3480 Encounter for supervision of other normal pregnancy, unspecified trimester: Secondary | ICD-10-CM

## 2022-05-03 DIAGNOSIS — O09893 Supervision of other high risk pregnancies, third trimester: Secondary | ICD-10-CM

## 2022-05-03 DIAGNOSIS — O99019 Anemia complicating pregnancy, unspecified trimester: Secondary | ICD-10-CM

## 2022-05-03 NOTE — Progress Notes (Signed)
Subjective:  Dawn Krause is a 22 y.o. G4P3003 at [redacted]w[redacted]d being seen today for ongoing prenatal care.  She is currently monitored for the following issues for this low-risk pregnancy and has Anemia; Short interval between pregnancies affecting pregnancy, antepartum; Supervision of other normal pregnancy, antepartum; and Nausea and vomiting during pregnancy on their problem list.  Patient reports no complaints.  Contractions: Not present. Vag. Bleeding: None.  Movement: Present. Denies leaking of fluid.   The following portions of the patient's history were reviewed and updated as appropriate: allergies, current medications, past family history, past medical history, past social history, past surgical history and problem list. Problem list updated.  Objective:   Vitals:   05/03/22 1135  BP: 126/70  Pulse: 95  Weight: 140 lb 12.8 oz (63.9 kg)    Fetal Status: Fetal Heart Rate (bpm): 131   Movement: Present     General:  Alert, oriented and cooperative. Patient is in no acute distress.  Skin: Skin is warm and dry. No rash noted.   Cardiovascular: Normal heart rate noted  Respiratory: Normal respiratory effort, no problems with respiration noted  Abdomen: Soft, gravid, appropriate for gestational age. Pain/Pressure: Absent     Pelvic:  Cervical exam deferred        Extremities: Normal range of motion.  Edema: Trace  Mental Status: Normal mood and affect. Normal behavior. Normal judgment and thought content.   Urinalysis:      Assessment and Plan:  Pregnancy: G4P3003 at [redacted]w[redacted]d  1. Supervision of other normal pregnancy, antepartum  2. Short interval between pregnancies affecting pregnancy, antepartum  3. Anemia affecting pregnancy, antepartum - taking iron / PNV's   Preterm labor symptoms and general obstetric precautions including but not limited to vaginal bleeding, contractions, leaking of fluid and fetal movement were reviewed in detail with the patient. Please refer to After  Visit Summary for other counseling recommendations.   Return in about 2 weeks (around 05/17/2022) for ROB.   Shelly Bombard, MD 05/03/2022

## 2022-05-03 NOTE — Progress Notes (Signed)
Pt presents for ROB without complaints today.  

## 2022-05-17 ENCOUNTER — Encounter: Payer: Medicaid Other | Admitting: Obstetrics

## 2022-05-17 ENCOUNTER — Telehealth: Payer: Self-pay | Admitting: *Deleted

## 2022-05-17 ENCOUNTER — Encounter: Payer: Self-pay | Admitting: Obstetrics

## 2022-05-17 NOTE — Telephone Encounter (Signed)
TC from pt reporting frequent, painful UC's today. Reports has had UC's off and on for a few days, but they are "really bad" today. Advised pt to seek care in MAU for PTL evaluation. Pt verbalized understanding.

## 2022-05-18 ENCOUNTER — Encounter (HOSPITAL_BASED_OUTPATIENT_CLINIC_OR_DEPARTMENT_OTHER): Payer: Self-pay

## 2022-05-18 ENCOUNTER — Inpatient Hospital Stay (HOSPITAL_BASED_OUTPATIENT_CLINIC_OR_DEPARTMENT_OTHER)
Admission: EM | Admit: 2022-05-18 | Discharge: 2022-05-20 | DRG: 807 | Disposition: A | Discharge status: Home / Self Care | Payer: Medicaid Other | Attending: Family Medicine | Admitting: Family Medicine

## 2022-05-18 ENCOUNTER — Other Ambulatory Visit: Payer: Self-pay

## 2022-05-18 DIAGNOSIS — O42013 Preterm premature rupture of membranes, onset of labor within 24 hours of rupture, third trimester: Secondary | ICD-10-CM | POA: Diagnosis not present

## 2022-05-18 DIAGNOSIS — O09899 Supervision of other high risk pregnancies, unspecified trimester: Secondary | ICD-10-CM

## 2022-05-18 DIAGNOSIS — Z3A33 33 weeks gestation of pregnancy: Secondary | ICD-10-CM

## 2022-05-18 DIAGNOSIS — Z3480 Encounter for supervision of other normal pregnancy, unspecified trimester: Secondary | ICD-10-CM

## 2022-05-18 DIAGNOSIS — O219 Vomiting of pregnancy, unspecified: Secondary | ICD-10-CM | POA: Diagnosis present

## 2022-05-18 DIAGNOSIS — O9902 Anemia complicating childbirth: Secondary | ICD-10-CM | POA: Diagnosis present

## 2022-05-18 DIAGNOSIS — D649 Anemia, unspecified: Secondary | ICD-10-CM | POA: Diagnosis present

## 2022-05-18 LAB — BASIC METABOLIC PANEL
Anion gap: 9 (ref 5–15)
BUN: 8 mg/dL (ref 6–20)
CO2: 20 mmol/L — ABNORMAL LOW (ref 22–32)
Calcium: 9.1 mg/dL (ref 8.9–10.3)
Chloride: 103 mmol/L (ref 98–111)
Creatinine, Ser: 0.59 mg/dL (ref 0.44–1.00)
GFR, Estimated: >60 mL/min (ref >60)
Glucose, Bld: 107 mg/dL — ABNORMAL HIGH (ref 70–99)
Potassium: 3.8 mmol/L (ref 3.5–5.1)
Sodium: 132 mmol/L — ABNORMAL LOW (ref 135–145)

## 2022-05-18 LAB — CBC
HCT: 31.9 % — ABNORMAL LOW (ref 36.0–46.0)
HCT: 30.4 % — ABNORMAL LOW (ref 36.0–46.0)
Hemoglobin: 9.8 g/dL — ABNORMAL LOW (ref 12.0–15.0)
Hemoglobin: 9.4 g/dL — ABNORMAL LOW (ref 12.0–15.0)
MCH: 22.8 pg — ABNORMAL LOW (ref 26.0–34.0)
MCH: 23.2 pg — ABNORMAL LOW (ref 26.0–34.0)
MCHC: 30.7 g/dL (ref 30.0–36.0)
MCHC: 30.9 g/dL (ref 30.0–36.0)
MCV: 74.4 fL — ABNORMAL LOW (ref 80.0–100.0)
MCV: 74.9 fL — ABNORMAL LOW (ref 80.0–100.0)
Platelets: 212 10*3/uL (ref 150–400)
Platelets: 184 10*3/uL (ref 150–400)
RBC: 4.29 MIL/uL (ref 3.87–5.11)
RBC: 4.06 MIL/uL (ref 3.87–5.11)
RDW: 19.1 % — ABNORMAL HIGH (ref 11.5–15.5)
RDW: 18.6 % — ABNORMAL HIGH (ref 11.5–15.5)
WBC: 14.4 10*3/uL — ABNORMAL HIGH (ref 4.0–10.5)
WBC: 13.8 10*3/uL — ABNORMAL HIGH (ref 4.0–10.5)
nRBC: 0 % (ref 0.0–0.2)
nRBC: 0 % (ref 0.0–0.2)

## 2022-05-18 LAB — TYPE AND SCREEN
ABO/RH(D): A POS
Antibody Screen: NEGATIVE

## 2022-05-18 MED ORDER — SODIUM CHLORIDE 0.9 % IV SOLN: 1.0000 g | INTRAVENOUS | Status: DC

## 2022-05-18 MED ORDER — ACETAMINOPHEN 325 MG PO TABS: 650.0000 mg | ORAL_TABLET | ORAL | Status: DC | PRN

## 2022-05-18 MED ORDER — SIMETHICONE 80 MG PO CHEW: 80.0000 mg | CHEWABLE_TABLET | ORAL | Status: DC | PRN

## 2022-05-18 MED ORDER — SODIUM CHLORIDE 0.9 % IV SOLN: 2.0000 g | Freq: Once | INTRAVENOUS | Status: DC

## 2022-05-18 MED ORDER — BENZOCAINE-MENTHOL 20-0.5 % EX AERO: 1.0000 | INHALATION_SPRAY | CUTANEOUS | Status: DC | PRN

## 2022-05-18 MED ORDER — LIDOCAINE HCL (PF) 1 % IJ SOLN: 30.0000 mL | INTRAMUSCULAR | Status: DC | PRN

## 2022-05-18 MED ORDER — OXYCODONE HCL 5 MG PO TABS
5.0000 mg | ORAL_TABLET | ORAL | Status: DC | PRN
  Administered 2022-05-18 – 2022-05-20 (×5): 5 mg via ORAL
  Filled 2022-05-18 (×5): qty 1

## 2022-05-18 MED ORDER — DIBUCAINE (PERIANAL) 1 % EX OINT: 1.0000 | TOPICAL_OINTMENT | CUTANEOUS | Status: DC | PRN

## 2022-05-18 MED ORDER — LACTATED RINGERS IV SOLN: 500.0000 mL | INTRAVENOUS | Status: DC | PRN

## 2022-05-18 MED ORDER — DIPHENHYDRAMINE HCL 25 MG PO CAPS: 25.0000 mg | ORAL_CAPSULE | Freq: Four times a day (QID) | ORAL | Status: DC | PRN

## 2022-05-18 MED ORDER — ACETAMINOPHEN 325 MG PO TABS
650.0000 mg | ORAL_TABLET | ORAL | Status: DC | PRN
  Administered 2022-05-18 – 2022-05-20 (×2): 650 mg via ORAL
  Filled 2022-05-18 (×2): qty 2

## 2022-05-18 MED ORDER — IBUPROFEN 600 MG PO TABS
600.0000 mg | ORAL_TABLET | Freq: Four times a day (QID) | ORAL | Status: DC
  Administered 2022-05-18 – 2022-05-20 (×9): 600 mg via ORAL
  Filled 2022-05-18 (×8): qty 1

## 2022-05-18 MED ORDER — OXYCODONE HCL 5 MG PO TABS
10.0000 mg | ORAL_TABLET | ORAL | Status: DC | PRN
  Administered 2022-05-20: 10 mg via ORAL
  Filled 2022-05-18: qty 2

## 2022-05-18 MED ORDER — TRANEXAMIC ACID-NACL 1000-0.7 MG/100ML-% IV SOLN: 1000.0000 mg | INTRAVENOUS | Status: DC

## 2022-05-18 MED ORDER — OXYTOCIN-SODIUM CHLORIDE 30-0.9 UT/500ML-% IV SOLN
INTRAVENOUS | Status: AC
  Filled 2022-05-18: qty 500

## 2022-05-18 MED ORDER — PRENATAL MULTIVITAMIN CH
1.0000 | ORAL_TABLET | Freq: Every day | ORAL | Status: DC
  Administered 2022-05-19 – 2022-05-20 (×2): 1 via ORAL
  Filled 2022-05-18 (×2): qty 1

## 2022-05-18 MED ORDER — COCONUT OIL OIL
1.0000 | TOPICAL_OIL | Status: DC | PRN
  Administered 2022-05-18: 1 via TOPICAL

## 2022-05-18 MED ORDER — ONDANSETRON HCL 4 MG/2ML IJ SOLN: 4.0000 mg | Freq: Four times a day (QID) | INTRAMUSCULAR | Status: DC | PRN

## 2022-05-18 MED ORDER — WITCH HAZEL-GLYCERIN EX PADS: 1.0000 | MEDICATED_PAD | CUTANEOUS | Status: DC | PRN

## 2022-05-18 MED ORDER — LACTATED RINGERS IV SOLN: INTRAVENOUS | Status: DC

## 2022-05-18 MED ORDER — ONDANSETRON HCL 4 MG/2ML IJ SOLN: 4.0000 mg | INTRAMUSCULAR | Status: DC | PRN

## 2022-05-18 MED ORDER — OXYCODONE-ACETAMINOPHEN 5-325 MG PO TABS: 1.0000 | ORAL_TABLET | ORAL | Status: DC | PRN

## 2022-05-18 MED ORDER — SOD CITRATE-CITRIC ACID 500-334 MG/5ML PO SOLN: 30.0000 mL | ORAL | Status: DC | PRN

## 2022-05-18 MED ORDER — BETAMETHASONE SOD PHOS & ACET 6 (3-3) MG/ML IJ SUSP: 12.0000 mg | INTRAMUSCULAR | Status: DC

## 2022-05-18 MED ORDER — OXYCODONE-ACETAMINOPHEN 5-325 MG PO TABS: 2.0000 | ORAL_TABLET | ORAL | Status: DC | PRN

## 2022-05-18 MED ORDER — OXYTOCIN BOLUS FROM INFUSION
333.0000 mL | Freq: Once | INTRAVENOUS | Status: AC
  Administered 2022-05-18: 333 mL via INTRAVENOUS

## 2022-05-18 MED ORDER — OXYTOCIN-SODIUM CHLORIDE 30-0.9 UT/500ML-% IV SOLN: 2.5000 [IU]/h | INTRAVENOUS | Status: DC

## 2022-05-18 MED ORDER — ONDANSETRON HCL 4 MG PO TABS: 4.0000 mg | ORAL_TABLET | ORAL | Status: DC | PRN

## 2022-05-18 MED ORDER — DOCUSATE SODIUM 100 MG PO CAPS
100.0000 mg | ORAL_CAPSULE | Freq: Two times a day (BID) | ORAL | Status: DC
  Administered 2022-05-18 – 2022-05-19 (×3): 100 mg via ORAL
  Filled 2022-05-18 (×5): qty 1

## 2022-05-18 NOTE — Progress Notes (Signed)
Pt is G4P3 at 33.1wks who presents to [facility] complaining of contractions that began Mon but increased in severity last night. She was directed to go to MAU yesterday by [facility], but did not do so.  She also reports having some spotting/bloody discharge.  Pt endorses fetal movement.  FHR monitors applied.  RROB to monitor remotely.  Dr Roselie Awkward notified of pt and gives order for transfer to MAU for further evaluation.

## 2022-05-18 NOTE — Lactation Note (Signed)
This note was copied from a baby's chart.  NICU Lactation Consultation Note  Patient Name: Dawn Krause Date: 05/18/2022 Age:23 hours  Subjective Reason for consult: Initial assessment; NICU baby; Infant < 6lbs; Preterm <34wks  Visited with family of 80 hours old pre-term NICU female; Dawn Krause is a P4 and has some experience breastfeeding. Assisted with the initiation of pumping at 4 hours post-partum, she already had the pump set up in the room but she didn't have a chance to use it yet. Reviewed pumping schedule, lactogenesis II, benefits of STS care and anticipatory guidelines.  Objective Infant data: Mother's Current Feeding Choice: Breast Milk  Maternal data: [id number]  Vaginal, Spontaneous Significant Breast History:: (+) breast changes during the pregnancy Current breast feeding challenges:: NICU admission Previous breastfeeding challenges?: Other (Comment) (She felt that babies were not getting full) Does the patient have breastfeeding experience prior to this delivery?: Yes How long did the patient breastfeed?: 1st 2-3 months, 2nd one for two days and 3rd one for 4 months Pumping frequency: intiated pumping at 4 hours of life Pumped volume: 0 mL (droplets) Flange Size: 24 Risk factor for low milk supply:: prematurity, infant separation WIC Program: Yes WIC Referral Sent?: Yes Pump:  (GOB purchased a DEBP but she lives in [place])  Assessment Infant: Feeding Status: -- (scheduled feedings)  Maternal: Milk volume: Normal  Intervention/Plan Interventions: Breast feeding basics reviewed; Coconut oil; DEBP; Education; Infant Driven Feeding Algorithm education; [employer] brochure Tools: Pump; Flanges; Coconut oil Pump Education: Setup, frequency, and cleaning; Milk Storage  Plan of care: Encouraged pumping every 3 hours, ideally 8 pumping sessions/24 hours Breast massage, hand expression and coconut oil were also encouraged prior pumping  No other support  person at this time. All questions and concerns answered, family to contact [facility] services PRN.  Consult Status: NICU follow-up  NICU Follow-up type: New admission follow up; Maternal D/C visit   Keisy Strickler Francene Boyers 05/18/2022, 5:58 PM

## 2022-05-18 NOTE — ED Notes (Signed)
Called [facility] and spoke to [place]; informed that patient is ready for transport to MAU STAT

## 2022-05-18 NOTE — ED Provider Notes (Addendum)
[facility] EMERGENCY DEPT Provider Note   CSN: [id number] Arrival date & time: 05/18/22  1140     History  Chief Complaint  Patient presents with   Contractions    Dawn Krause is a 22 y.o. female.  Patient followed by [facility] for women's health care at [facility], patient started with contractions on Monday.  Was advised by her OB/GYN to go to MAU at [facility] yesterday.  Things got more intense overnight.  Is having a little bit of vaginal spotting.  Patient is feeling as if she has some strong contractions.  She is gravida 4 para 3 last seen by her OB/GYN on January 3.  Is a low risk pregnancy she does have anemia short interval between pregnancies affecting the pregnancy.  Past medical history noncontributory.  Cording to patient water is not broken.  No heavy bleeding.  Patient's blood pressure here is 137/80.  Heart rate 104 temp 98.1.  Respirations 20 oxygen saturations 100% on room air.  Estimated due date is July 05, 2022.       Home Medications Prior to Admission medications   Medication Sig Start Date End Date Taking? Authorizing Provider  acetaminophen (TYLENOL) 325 MG tablet Take 2 tablets (650 mg total) by mouth every 6 (six) hours as needed for mild pain, moderate pain, fever or headache. Patient not taking: Reported on 05/03/2022 03/18/20   Randa Ngo, MD  Blood Pressure Monitoring (BLOOD PRESSURE KIT) DEVI 1 kit by Does not apply route once a week. 10/15/20   Constant, Peggy, MD  ferrous sulfate (FERROUSUL) 325 (65 FE) MG tablet Take 1 tablet (325 mg total) by mouth every other day. 04/14/22   Inez Catalina, MD  Misc. Devices (GOJJI WEIGHT SCALE) MISC 1 Device by Does not apply route every 30 (thirty) days. 10/15/20   Constant, Peggy, MD  ondansetron (ZOFRAN) 4 MG tablet Take 1 tablet (4 mg total) by mouth every 8 (eight) hours as needed for nausea or vomiting. Patient not taking: Reported on 05/03/2022  04/07/22   Chancy Milroy, MD  Prenat-Fe Poly-Methfol-FA-DHA (VITAFOL ULTRA) 29-0.6-0.4-200 MG CAPS Take 1 capsule by mouth daily before breakfast. 01/11/22   Griffin Basil, MD      Allergies    Gluten meal    Review of Systems   Review of Systems  Constitutional:  Negative for chills and fever.  HENT:  Negative for rhinorrhea and sore throat.   Eyes:  Negative for visual disturbance.  Respiratory:  Negative for cough and shortness of breath.   Cardiovascular:  Negative for chest pain and leg swelling.  Gastrointestinal:  Positive for abdominal pain. Negative for diarrhea, nausea and vomiting.  Genitourinary:  Positive for vaginal bleeding. Negative for dysuria.  Musculoskeletal:  Negative for back pain and neck pain.  Skin:  Negative for rash.  Neurological:  Negative for dizziness, light-headedness and headaches.  Hematological:  Does not bruise/bleed easily.  Psychiatric/Behavioral:  Negative for confusion.     Physical Exam Updated Vital Signs BP 120/81   Pulse (!) 106   Temp 98.1 F (36.7 C) (Oral)   Resp 20   Ht 1.575 m (5\' 2" )   Wt 63.9 kg   LMP 09/10/2021   SpO2 98%   BMI 25.77 kg/m  Physical Exam Vitals and nursing note reviewed.  Constitutional:      General: She is not in acute distress.    Appearance: Normal appearance. She is well-developed.  HENT:  Head: Normocephalic and atraumatic.  Eyes:     Extraocular Movements: Extraocular movements intact.     Conjunctiva/sclera: Conjunctivae normal.     Pupils: Pupils are equal, round, and reactive to light.  Cardiovascular:     Rate and Rhythm: Normal rate and regular rhythm.     Heart sounds: No murmur heard. Pulmonary:     Effort: Pulmonary effort is normal. No respiratory distress.     Breath sounds: Normal breath sounds.  Abdominal:     General: There is distension.     Palpations: Abdomen is soft.     Tenderness: There is no abdominal tenderness.     Comments: Gravid abdomen approximately 32  weeks.  Genitourinary:    Comments: No heavy vaginal bleeding. Musculoskeletal:        General: No swelling.     Cervical back: Normal range of motion and neck supple.  Skin:    General: Skin is warm and dry.     Capillary Refill: Capillary refill takes less than 2 seconds.  Neurological:     General: No focal deficit present.     Mental Status: She is alert and oriented to person, place, and time.  Psychiatric:        Mood and Affect: Mood normal.     ED Results / Procedures / Treatments   Labs (all labs ordered are listed, but only abnormal results are displayed) Labs Reviewed  CBC - Abnormal; Notable for the following components:      Result Value   WBC 14.4 (*)    Hemoglobin 9.8 (*)    HCT 31.9 (*)    MCV 74.4 (*)    MCH 22.8 (*)    RDW 19.1 (*)    All other components within normal limits  BASIC METABOLIC PANEL    EKG None  Radiology No results found.  Procedures Procedures    Medications Ordered in ED Medications - No data to display  ED Course/ Medical Decision Making/ A&P                             Medical Decision Making Amount and/or Complexity of Data Reviewed Labs: ordered.  Risk Decision regarding hospitalization.   Fetal heart rates around 133.  Patient with fetal monitoring ongoing rapid response OB nurse contacted.  Observe the strips.  She spoke with Dr. Roselie Awkward at [facility] and patient will be transferred by [facility] there.  For further monitoring.  Patient encouraged not to push with contractions.  Patient currently stable.  Patient reassured  CRITICAL CARE Performed by: Fredia Sorrow Total critical care time: 35 minutes Critical care time was exclusive of separately billable procedures and treating other patients. Critical care was necessary to treat or prevent imminent or life-threatening deterioration. Critical care was time spent personally by me on the following activities: development of treatment plan with  patient and/or surrogate as well as nursing, discussions with consultants, evaluation of patient's response to treatment, examination of patient, obtaining history from patient or surrogate, ordering and performing treatments and interventions, ordering and review of laboratory studies, ordering and review of radiographic studies, pulse oximetry and re-evaluation of patient's condition.  Final Clinical Impression(s) / ED Diagnoses Final diagnoses:  [redacted] weeks gestation of pregnancy    Rx / DC Orders ED Discharge Orders     None         Fredia Sorrow, MD 05/18/22 [place], [place],  MD 05/18/22 1226

## 2022-05-18 NOTE — H&P (Signed)
OBSTETRIC ADMISSION HISTORY AND PHYSICAL  Dawn Krause is a 22 y.o. female [phone] with IUP at [redacted]w[redacted]d by 19 week ultrasound presenting for contractions and vaginal bleeding. She reports contractions started last night but were not as bad. Worsened late in the night. She was transferred via [facility] from [facility]. Bleeding started soon after and she had concerns water broke on arrival to MAU. She reports +FMs, no blurry vision, headaches or peripheral edema, and RUQ pain.  She plans on breast/bottle feeding. She is unsure for birth control. She received her prenatal care at [facility]: By 19 week ultrasound --->  Estimated Date of Delivery: 07/05/22  Sono:    @[redacted]w[redacted]d , CWD, normal anatomy, cephalic presentation, posterior placenta, 292g, 63% EFW   Prenatal History/Complications:  - Preterm labor - Anemia - Short interval between pregnancies  Past Medical History: Past Medical History:  Diagnosis Date   Anemia    Infection    UTI    Past Surgical History: Past Surgical History:  Procedure Laterality Date   NO PAST SURGERIES      Obstetrical History: OB History     Gravida  4   Para  3   Term  3   Preterm      AB      Living  3      SAB      IAB      Ectopic      Multiple  0   Live Births  3           Social History Social History   Socioeconomic History   Marital status: Single    Spouse name: Not on file   Number of children: Not on file   Years of education: Not on file   Highest education level: Not on file  Occupational History   Not on file  Tobacco Use   Smoking status: Never   Smokeless tobacco: Never  Vaping Use   Vaping Use: Never used  Substance and Sexual Activity   Alcohol use: Not Currently   Drug use: Not Currently   Sexual activity: Not Currently  Other Topics Concern   Not on file  Social History Narrative   Not on file   Social Determinants of Health   Financial Resource Strain: Not on file  Food Insecurity: No  Food Insecurity (04/07/2022)   Hunger Vital Sign    Worried About Running Out of Food in the Last Year: Never true    Ran Out of Food in the Last Year: Never true  Transportation Needs: No Transportation Needs (04/07/2022)   PRAPARE - Hydrologist (Medical): No    Lack of Transportation (Non-Medical): No  Physical Activity: Not on file  Stress: Not on file  Social Connections: Not on file    Family History: Family History  Problem Relation Age of Onset   Diabetes Mother    Hypertension Mother    Healthy Father     Allergies: Allergies  Allergen Reactions   Gluten Meal Rash    Medications Prior to Admission  Medication Sig Dispense Refill Last Dose   acetaminophen (TYLENOL) 325 MG tablet Take 2 tablets (650 mg total) by mouth every 6 (six) hours as needed for mild pain, moderate pain, fever or headache. (Patient not taking: Reported on 05/03/2022)      Blood Pressure Monitoring (BLOOD PRESSURE KIT) DEVI 1 kit by Does not apply route once a week. 1 each 0  ferrous sulfate (FERROUSUL) 325 (65 FE) MG tablet Take 1 tablet (325 mg total) by mouth every other day. 60 tablet 0    Misc. Devices (GOJJI WEIGHT SCALE) MISC 1 Device by Does not apply route every 30 (thirty) days. 1 each 0    ondansetron (ZOFRAN) 4 MG tablet Take 1 tablet (4 mg total) by mouth every 8 (eight) hours as needed for nausea or vomiting. (Patient not taking: Reported on 05/03/2022) 20 tablet 2    Prenat-Fe Poly-Methfol-FA-DHA (VITAFOL ULTRA) 29-0.6-0.4-200 MG CAPS Take 1 capsule by mouth daily before breakfast. 90 capsule 4    Review of Systems   All systems reviewed and negative except as stated in HPI  Blood pressure 116/76, pulse (!) 102, temperature 98.1 F (36.7 C), temperature source Oral, resp. rate 18, height 5\' 2"  (1.575 m), weight 63.9 kg, last menstrual period 09/10/2021, SpO2 97 %, unknown if currently breastfeeding. General appearance: alert and moderate distress Lungs:  effort normal Heart: regular rate  Abdomen: soft, non-tender, gravid Extremities: no sign of DVT Presentation: cephalic Fetal monitoring: 135 bpm, moderate variability, +10x10 accels, +early decel Uterine activity: Q 2-66mins Dilation: 7 Effacement (%): 100 Station: Plus 1 Exam by:: Maryagnes Amos, CNM  Prenatal labs: ABO, Rh: A/Positive/-- (09/13 1541) Antibody: Negative (09/13 1541) Rubella: 2.04 (09/13 1541) RPR: Non Reactive (12/08 0934)  HBsAg: Negative (09/13 1541)  HIV: Non Reactive (12/08 0934)  GBS:   unknown 2 hr Glucola: 80/124/109 Genetic screening: LR NIPS Anatomy US: Normal  Prenatal Transfer Tool  Maternal Diabetes: No Genetic Screening: Normal Maternal Ultrasounds/Referrals: Normal Fetal Ultrasounds or other Referrals:  None Maternal Substance Abuse:  No Significant Maternal Medications:  None Significant Maternal Lab Results: Other:  GBS unknown  Results for orders placed or performed during the hospital encounter of 05/18/22 (from the past 24 hour(s))  CBC   Collection Time: 05/18/22 11:59 AM  Result Value Ref Range   WBC 14.4 (H) 4.0 - 10.5 K/uL   RBC 4.29 3.87 - 5.11 MIL/uL   Hemoglobin 9.8 (L) 12.0 - 15.0 g/dL   HCT 31.9 (L) 36.0 - 46.0 %   MCV 74.4 (L) 80.0 - 100.0 fL   MCH 22.8 (L) 26.0 - 34.0 pg   MCHC 30.7 30.0 - 36.0 g/dL   RDW 19.1 (H) 11.5 - 15.5 %   Platelets 212 150 - 400 K/uL   nRBC 0.0 0.0 - 0.2 %  Basic metabolic panel   Collection Time: 05/18/22 11:59 AM  Result Value Ref Range   Sodium 132 (L) 135 - 145 mmol/L   Potassium 3.8 3.5 - 5.1 mmol/L   Chloride 103 98 - 111 mmol/L   CO2 20 (L) 22 - 32 mmol/L   Glucose, Bld 107 (H) 70 - 99 mg/dL   BUN 8 6 - 20 mg/dL   Creatinine, Ser 0.59 0.44 - 1.00 mg/dL   Calcium 9.1 8.9 - 10.3 mg/dL   GFR, Estimated >60 >60 mL/min   Anion gap 9 5 - 15    Patient Active Problem List   Diagnosis Date Noted   Nausea and vomiting during pregnancy 04/07/2022   Supervision of other normal  pregnancy, antepartum 01/11/2022   Short interval between pregnancies affecting pregnancy, antepartum 11/15/2020   Anemia     Assessment/Plan:  Dawn Krause is a 22 y.o. G4P3003 at [redacted]w[redacted]d here for contractions and vaginal bleeding  #Labor: Admit to L&D, expectant management. BMZ ordered #Pain: Prn per patient request #FWB: Cat 1 #ID: GBS unknown > ampicillin  #  MOF: breast/bottle #MOC: undecided  #Circ: unknown   Brand Males, CNM  05/18/2022, 1:50 PM

## 2022-05-18 NOTE — MAU Note (Signed)
[facility] arrival transfer from [place] with c/o ctx and vag bleeding. Pt reports ctx started having mild discomfort on Monday. Then went away started last night and she could not sleep. Pain got worse and had more bloody show thsi morning. Good fetal movement reported. Denies any recent intercourse.

## 2022-05-18 NOTE — ED Notes (Signed)
Fetal heart monitor in place, RR RN for OB called. Fetal heart rate 140's, toco up to 19 with contraction baseline 9.

## 2022-05-18 NOTE — ED Triage Notes (Signed)
Patient here POV from Home.  Endorses Contractions to lower ABD Pain that began mildly Monday but began more severe Last PM.   Due Date: March 6th. 4 Total Pregnancies. 3 Living. No Complications regarding previous Deliveries.   Uncomfortable During Triage. A&Ox4. GCS 15. Ambulatory.

## 2022-05-18 NOTE — ED Notes (Signed)
RR OB RN called charge nurse Etta Quill with instructions from Dr. Emeterio Reeve to transfer stat to MAU.

## 2022-05-19 LAB — CBC
HCT: 28.7 % — ABNORMAL LOW (ref 36.0–46.0)
Hemoglobin: 8.6 g/dL — ABNORMAL LOW (ref 12.0–15.0)
MCH: 22.8 pg — ABNORMAL LOW (ref 26.0–34.0)
MCHC: 30 g/dL (ref 30.0–36.0)
MCV: 76.1 fL — ABNORMAL LOW (ref 80.0–100.0)
Platelets: 188 10*3/uL (ref 150–400)
RBC: 3.77 MIL/uL — ABNORMAL LOW (ref 3.87–5.11)
RDW: 19.2 % — ABNORMAL HIGH (ref 11.5–15.5)
WBC: 11.6 10*3/uL — ABNORMAL HIGH (ref 4.0–10.5)
nRBC: 0 % (ref 0.0–0.2)

## 2022-05-19 LAB — RPR: RPR Ser Ql: NONREACTIVE

## 2022-05-19 MED ORDER — SODIUM CHLORIDE 0.9 % IV SOLN
500.0000 mg | Freq: Once | INTRAVENOUS | Status: AC
  Administered 2022-05-19: 500 mg via INTRAVENOUS
  Filled 2022-05-19: qty 500

## 2022-05-19 NOTE — Progress Notes (Signed)
Patient screened out for psychosocial assessment since none of the following apply: Psychosocial stressors documented in mother or baby's chart Gestation less than 32 weeks Code at delivery  Infant with anomalies Please contact the Clinical Social Worker if specific needs arise,or by [employer] request.  MOB's  Edinburgh Postpartum Depression Screen Score is 0.  Laurey Arrow, MSW, LCSW Clinical Social Work [phone]

## 2022-05-19 NOTE — Progress Notes (Signed)
Patient ID: Dawn Krause, female   DOB: 07/26/2000, 22 y.o.   MRN: [id number]  POSTPARTUM PROGRESS NOTE  Postpartum Day 1  Subjective:  Dawn Krause is a 22 y.o. [id number] s/p SVD at [redacted]w[redacted]d.  No acute events overnight.  Pt denies problems with ambulating, voiding or po intake.  She denies nausea or vomiting.  Pain is poorly controlled, has only taken oxycodone once after delivery and has a lot of soreness in her legs from clenching trying not to push.  She has had flatus. She has not had bowel movement.  Lochia Small.   Objective: Blood pressure (!) 90/51, pulse 64, temperature 98.8 F (37.1 C), temperature source Oral, resp. rate 15, height 5\' 2"  (1.575 m), weight 140 lb 14 oz (63.9 kg), last menstrual period 09/10/2021, SpO2 99 %, unknown if currently breastfeeding.  Physical Exam:  General: alert, cooperative and no distress Chest: no respiratory distress Heart:regular rate, distal pulses intact Abdomen: soft, nontender,  Uterine Fundus: firm, appropriately tender DVT Evaluation: No calf swelling or tenderness Extremities: no edema Skin: warm, dr  Recent Labs    05/18/22 1403 05/19/22 0708  HGB 9.4* 8.6*  HCT 30.4* 28.7*    Assessment/Plan: Dawn Krause is a 22 y.o. O1Y0737 s/p SVD at [redacted]w[redacted]d   PPD#1 - Doing well Contraception: unsure Feeding: breast and formula Dispo: Plan for discharge tomorrow Anemia: will order venofer, resident to discuss with pt Pain: encouraged her to take oxycodone today and use a heating pad on her legs   LOS: 1 day   Gabriel Carina, CNM, CNM 05/19/2022, 11:42 AM

## 2022-05-19 NOTE — Lactation Note (Signed)
This note was copied from a baby's chart.  NICU Lactation Consultation Note  Patient Name: Dawn Krause [id number] Date: 05/19/2022 Age:23 hours  Subjective Reason for consult: Follow-up assessment; NICU baby; Preterm <34wks; Infant < 6lbs  Lactation followed up with Dawn Krause. She has moved with infant, Dawn Krause, to couplet care. I discussed her WIC pump status. She has an appointment set up for Tuesday; however, she will likely be discharged over the weekend. I recommended that she call Dawn Krause with Dawn Krause to move up her appointment to Monday.  I showed Dawn Krause how to use her manual pump. I noted some colostrum in her storage bottle that appeared to be expired. I recommended that she refrigerate her milk after pumping and reviewed milk storage guidelines.  I set up DEBP in room (has not used it since yesterday prior to moving rooms) and recommended that she pump q3 hours. I also recommended oral care with her EBM and to hold Dawn Krause STS.  I followed up with NICU RN to ask for printed breastmilk storage labels.  Objective Infant data: Mother's Current Feeding Choice: Breast Milk and Donor Milk  Maternal data: [id number]  Vaginal, Spontaneous Significant Breast History:: (+) breast changes during the pregnancy  Current breast feeding challenges:: NICU admission  Previous breastfeeding challenges?: Other (Comment) (She felt that babies were not getting full)  Does the patient have breastfeeding experience prior to this delivery?: Yes How long did the patient breastfeed?: 1st 2-3 months, 2nd one for two days and 3rd one for 4 months  Pumping frequency: hasn't pumped since yesterday Pumped volume: 5 mL Flange Size: 24  Risk factor for low milk supply:: prematurity, infant separation   WIC Program: Yes WIC Referral Sent?: Yes Pump:  (Following up with WIC)  Assessment Infant: Feeding Status: -- (scheduled feedings)   Maternal: Milk volume:  Normal   Intervention/Plan Interventions: Breast feeding basics reviewed; Hand pump; Education  Tools: Pump Pump Education: Setup, frequency, and cleaning; Milk Storage  Plan: Consult Status: NICU follow-up  NICU Follow-up type: Weekly NICU follow up; Verify onset of copious milk; Verify absence of engorgement    Dawn Krause 05/19/2022, 10:21 AM

## 2022-05-20 MED ORDER — BENZOCAINE-MENTHOL 20-0.5 % EX AERO: 1.0000 | INHALATION_SPRAY | CUTANEOUS | 0 refills | Status: DC | PRN

## 2022-05-20 MED ORDER — WITCH HAZEL-GLYCERIN EX PADS: 1.0000 | MEDICATED_PAD | CUTANEOUS | 12 refills | Status: DC | PRN

## 2022-05-20 MED ORDER — IBUPROFEN 600 MG PO TABS: 600.0000 mg | ORAL_TABLET | Freq: Four times a day (QID) | ORAL | 0 refills | Status: DC

## 2022-05-20 MED ORDER — ACETAMINOPHEN 325 MG PO TABS: 650.0000 mg | ORAL_TABLET | ORAL | 0 refills | Status: DC | PRN

## 2022-05-20 MED ORDER — DOCUSATE SODIUM 100 MG PO CAPS: 100.0000 mg | ORAL_CAPSULE | Freq: Two times a day (BID) | ORAL | 0 refills | Status: DC

## 2022-05-20 NOTE — Discharge Summary (Addendum)
Postpartum Discharge Summary  Patient Name: Dawn Krause DOB: 02/02/2001 MRN: [id number]  Date of admission: 05/18/2022 Delivery date:05/18/2022  Delivering provider: Darlina Rumpf  Date of discharge: 05/20/2022  Admitting diagnosis: [redacted] weeks gestation of pregnancy [Z3A.33] Indication for care in labor or delivery [O75.9] Intrauterine pregnancy: [redacted]w[redacted]d     Secondary diagnosis:  Principal Problem:   Normal spontaneous vaginal delivery Active Problems:   Anemia   Short interval between pregnancies affecting pregnancy, antepartum   Supervision of other normal pregnancy, antepartum   Nausea and vomiting during pregnancy   Indication for care in labor or delivery   Preterm labor   Preterm delivery  Additional problems: n/a    Discharge diagnosis: Preterm Pregnancy Delivered                                              Post partum procedures: n/a Augmentation: N/A Complications: None  Hospital course: Onset of Labor With Vaginal Delivery      22 y.o. yo P5T6144 at [redacted]w[redacted]d was admitted in Active Labor on 05/18/2022. Labor course was complicated bynone  Membrane Rupture Time/Date: 1:35 PM ,05/18/2022   Delivery Method:Vaginal, Spontaneous  Episiotomy: None  Lacerations:  None  Patient had a postpartum course complicated by none.  She is ambulating, tolerating a regular diet, passing flatus, and urinating well. Patient is discharged home in stable condition on 05/20/22.  Newborn Data: Birth date:05/18/2022  Birth time:1:41 PM  Gender:Female  Living status:Living  Apgars:9 ,8  Weight:2260 g   Magnesium Sulfate received: No BMZ received: No Rhophylac:N/A MMR:N/A T-DaP: declined Flu: No Transfusion:No  Physical exam  Vitals:   05/19/22 0359 05/19/22 1205 05/19/22 2146 05/20/22 0241  BP: (!) 90/51 96/68 110/62 106/64  Pulse: 64 64 67 64  Resp: 15 16 16 17   Temp: 98.8 F (37.1 C) 98.6 F (37 C) 98.6 F (37 C) 97.6 F (36.4 C)  TempSrc: Oral Oral Oral Oral  SpO2: 99%   98% 99%  Weight:      Height:       General: alert, cooperative, and no distress Lochia: appropriate Uterine Fundus: firm Incision: N/A DVT Evaluation: No evidence of DVT seen on physical exam. Labs: Lab Results  Component Value Date   WBC 11.6 (H) 05/19/2022   HGB 8.6 (L) 05/19/2022   HCT 28.7 (L) 05/19/2022   MCV 76.1 (L) 05/19/2022   PLT 188 05/19/2022      Latest Ref Rng & Units 05/18/2022   11:59 AM  CMP  Glucose 70 - 99 mg/dL 107   BUN 6 - 20 mg/dL 8   Creatinine 0.44 - 1.00 mg/dL 0.59   Sodium 135 - 145 mmol/L 132   Potassium 3.5 - 5.1 mmol/L 3.8   Chloride 98 - 111 mmol/L 103   CO2 22 - 32 mmol/L 20   Calcium 8.9 - 10.3 mg/dL 9.1    Edinburgh Score:    05/18/2022    8:34 PM  Edinburgh Postnatal Depression Scale Screening Tool  I have been able to laugh and see the funny side of things. 0  I have looked forward with enjoyment to things. 0  I have blamed myself unnecessarily when things went wrong. 0  I have been anxious or worried for no good reason. 0  I have felt scared or panicky for no good reason. 0  Things have been getting on top  of me. 0  I have been so unhappy that I have had difficulty sleeping. 0  I have felt sad or miserable. 0  I have been so unhappy that I have been crying. 0  The thought of harming myself has occurred to me. 0  Edinburgh Postnatal Depression Scale Total 0     After visit meds:  Allergies as of 05/20/2022       Reactions   Gluten Meal Rash        Medication List     STOP taking these medications    ondansetron 4 MG tablet Commonly known as: Zofran       TAKE these medications    acetaminophen 325 MG tablet Commonly known as: Tylenol Take 2 tablets (650 mg total) by mouth every 4 (four) hours as needed (for pain scale < 4). What changed:  when to take this reasons to take this   benzocaine-Menthol 20-0.5 % Aero Commonly known as: DERMOPLAST Apply 1 Application topically as needed for irritation  (perineal discomfort).   Blood Pressure Kit Devi 1 kit by Does not apply route once a week.   docusate sodium 100 MG capsule Commonly known as: COLACE Take 1 capsule (100 mg total) by mouth 2 (two) times daily.   ferrous sulfate 325 (65 FE) MG tablet Commonly known as: FerrouSul Take 1 tablet (325 mg total) by mouth every other day.   Gojji Weight Scale Misc 1 Device by Does not apply route every 30 (thirty) days.   ibuprofen 600 MG tablet Commonly known as: ADVIL Take 1 tablet (600 mg total) by mouth every 6 (six) hours.   Vitafol Ultra 29-0.6-0.4-200 MG Caps Take 1 capsule by mouth daily before breakfast.   witch hazel-glycerin pad Commonly known as: TUCKS Apply 1 Application topically as needed for hemorrhoids.         Discharge home in stable condition Infant Feeding: Bottle and Breast Infant Disposition:NICU Discharge instruction: per After Visit Summary and Postpartum booklet. Activity: Advance as tolerated. Pelvic rest for 6 weeks.  Diet: routine diet Future Appointments: Future Appointments  Date Time Provider [place]  05/25/2022 11:15 AM Shelly Bombard, MD [facility] None   Follow up Visit: Message sent to [facility] 1/20  Please schedule this patient for a In person postpartum visit in 6 weeks with the following provider: Any provider. Additional Postpartum F/U: n/a   Low risk pregnancy complicated by:  na Delivery mode:  Vaginal, Spontaneous  Anticipated Birth Control:  Unsure   05/20/2022 Shelda Pal, DO

## 2022-05-20 NOTE — Progress Notes (Signed)
Circumcision Consent   -Circumcision procedure details discussed including usage of local anesthesia, infant soother, and removal and disposal of foreskin. -Risks and benefits of procedure were reviewed including, but not limited to:  *Benefits include reduction in the rates of urinary tract infection (UTI), penile cancer, some sexually transmitted infections, penile inflammatory, and retractile disorders, as well as easier hygiene.   *Risks include bleeding, infection, injury of glans which may lead to need for additional surgery, penile deformity, or urinary tract issues, unsatisfactory cosmetic appearance and other potential complications related to the procedure.   -Informed that procedure will not be performed if provider deems inappropriate d/t penile size, noted deformity, or unsatisfactory pediatric evaluation. -It was emphasized that this is an elective procedure.   -Post circumcision care discussed.   Patient wants to proceed with circumcision. Informed that team would be notified and complete prior to discharge and upon satisfactory pediatric evaluation of infant.    Talicia Sui Q Mercado-Ortiz, DO FMOB Fellow, Faculty practice [place]     

## 2022-05-22 LAB — SURGICAL PATHOLOGY

## 2022-05-24 ENCOUNTER — Ambulatory Visit: Payer: Medicaid Other

## 2022-05-25 ENCOUNTER — Encounter: Payer: Medicaid Other | Admitting: Obstetrics

## 2022-05-26 ENCOUNTER — Telehealth (HOSPITAL_COMMUNITY): Payer: Self-pay | Admitting: *Deleted

## 2022-05-26 NOTE — Telephone Encounter (Signed)
Mom reports feeling good. No concerns about herself at this time. EPDS not completed. Mom reports feeling well emotionally. [facility] score=0) Mom reports baby is doing well in the NICU.  Odis Hollingshead, RN 05-26-2022 at 10:39am

## 2022-05-29 ENCOUNTER — Ambulatory Visit (INDEPENDENT_AMBULATORY_CARE_PROVIDER_SITE_OTHER): Payer: Medicaid Other | Admitting: Obstetrics & Gynecology

## 2022-05-29 ENCOUNTER — Encounter: Payer: Self-pay | Admitting: Obstetrics & Gynecology

## 2022-05-29 NOTE — Progress Notes (Signed)
Pt states she wants to discuss going back to work, baby is in NICU. Pt states baby may be home in the next 1-2 weeks.  Pt states she is hoping to take more time off once the baby is home.   She is doing well and may return to work with f/u in 4 weeks  [place] Mode, MD 05/29/2022

## 2022-05-31 ENCOUNTER — Ambulatory Visit: Payer: Self-pay

## 2022-05-31 NOTE — Lactation Note (Signed)
This note was copied from a baby's chart.  NICU Lactation Consultation Note  Patient Name: Dawn Krause [id number] Date: 05/31/2022 Age:23 days  Subjective Reason for consult: NICU baby; Late-preterm 34-36.6wks; Infant < 6lbs; Weekly NICU follow-up  Visited with family of 46 9/26 weeks old (adjusted) NICU female,  [name] is a P4 and has some experience breastfeeding. She voiced baby started getting bottles today but she would also try to take him to breast. Asked her to call for assistance when needed. She has been pumping but not consistently. She said they run out of bottles. Provided additional storage bottles per parent's request. Explained the importance of consistent pumping to protect her supply, reviewed her goals, she would like to keep baby on breastmilk for as long as she can. FOB request phone # for the milk bank in case they would like to donate her breastmilk. Discussed that her supply is currently borderline low but both parents voiced that if she starts pumping every 3 hours, her supply will greatly increase like it did with her other babies. Reviewed pumping schedule, lactogenesis III and anticipatory guidelines.   Objective Infant data: Mother's Current Feeding Choice: Breast Milk  Infant feeding assessment Scale for Readiness: 2 Scale for Quality: 2  Maternal data: [id number]  Vaginal, Spontaneous Pumping frequency: 3-4 times/24 hours Pumped volume: 180 mL Flange Size: 24 WIC Program: Yes WIC Referral Sent?: Yes Pump: Personal (WIC pump)  Assessment Infant: Feeding Status: -- (Scheduled feedings)  Maternal: Milk volume: Low  Intervention/Plan Interventions: Breast feeding basics reviewed; DEBP; Education Tools: Pump; Flanges Pump Education: Setup, frequency, and cleaning; Milk Storage  Plan of care: Encouraged bilateral pumping every 3 hours, ideally 6-8 pumping sessions/24 hours She'll try power pumping in the AM   FOB present and very supportive. All  questions and concerns answered, family to contact [facility] services PRN.  Consult Status: NICU follow-up  NICU Follow-up type: Weekly NICU follow up; Assist with IDF-2 (Mother does not need to pre-pump before breastfeeding)   Dawn Krause Dawn Krause 05/31/2022, 12:58 PM

## 2022-06-10 ENCOUNTER — Ambulatory Visit: Payer: Self-pay

## 2022-06-10 NOTE — Lactation Note (Signed)
This note was copied from a baby's chart.  NICU Lactation Consultation Note  Patient Name: Dawn Krause [id number] Date: 06/10/2022 Age:23 wk.o.  Subjective Reason for consult: Follow-up assessment; Other (Comment); NICU baby; Late-preterm 34-36.6wks; Infant < 6lbs (Telephone call)  [place] in the room to visit with family of 23 23/61 weeks old (adjusted) NICU female, but Ms. Frame hasn't come in for the day yet; it's been mainly her fiance (FOB) the one coming to the hospital to drop her breastmilk. Saw a note from NICU RN Karle Starch regarding disposing expired milk. Spoke to Ms. Rowlette over the phone and re-educated on breastmilk storage guidelines for refrigerated and frozen. She voiced that pumping is going well, her volumes have increased as she started pumping consistently, praised her for her efforts. She would appreciate to have more bottles to take home, this LC will be leaving two bags full of breastmilk storage bottles in baby's room for family to pick up the next time they come in the unit. She also asked some questions she had regarding discharge timing and planning, encouraged her to speak with baby's doctor and provided her the days/times we do medical rounds on the floor and/or to call baby's RN for updates at her convenience.   Objective Infant data: Mother's Current Feeding Choice: Breast Milk  Infant feeding assessment Scale for Readiness: 2 (rooting but gagging with pacifier and then spit - did not po feed) Scale for Quality: 3  Maternal data: [id number]  Vaginal, Spontaneous Pumping frequency: 8 times/24 hours Pumped volume: 360 mL Flange Size: 24 WIC Program: Yes WIC Referral Sent?: Yes Pump: Personal (WIC pump)  Assessment Infant: In NICU  Maternal: Milk volume: Abundant  Intervention/Plan Interventions: Education Tools: Pump; Flanges Pump Education: Setup, frequency, and cleaning; Milk Storage  Plan of care: Encouraged to continue bilateral pumping every 3 hours,  ideally 6-8 pumping sessions/24 hours She'll call for latch assistance when she's ready to take baby to breast   All questions and concerns answered, family to contact [facility] services PRN.  Consult Status: NICU follow-up  NICU Follow-up type: Weekly NICU follow up; Assist with IDF-2 (Mother does not need to pre-pump before breastfeeding)   Bradie Lacock Francene Boyers 06/10/2022, 4:03 PM

## 2022-06-10 NOTE — Lactation Note (Signed)
This note was copied from a baby's chart. Lactation Consultation Note  Patient Name: Boy Anji Terman [id number] Date: 06/10/2022 Reason for consult: Follow-up assessment;Other (Comment);NICU baby;Late-preterm 34-36.6wks;Infant < 6lbs (Telephone call) Age:23 wk.o.  This LC left two bags with breastmilk storage containers in baby's room per Ms. Granada's request, family will be picking them up the next time they come to the NICU. Continue current plan of care.  Lactation Tools Discussed/Used Tools: Pump;Flanges Flange Size: 24 Breast pump type: Double-Electric Breast Pump Pump Education: Setup, frequency, and cleaning;Milk Storage Reason for Pumping: LPI in NICU Pumping frequency: 8 times/24 hours Pumped volume: 360 mL  Interventions Interventions: Education  Consult Status Consult Status: NICU follow-up Date: 06/10/22 Follow-up type: In-patient   Arlina Sabina Francene Boyers 06/10/2022, 5:13 PM

## 2022-06-19 ENCOUNTER — Ambulatory Visit: Payer: Self-pay

## 2022-06-19 NOTE — Lactation Note (Signed)
This note was copied from a baby's chart.  NICU Lactation Consultation Note  Patient Name: Dawn Krause [id number] Date: 06/19/2022 Age:23 wk.o.  Subjective Reason for consult: Follow-up assessment; Other (Comment); NICU baby; Late-preterm 34-36.6wks (Telephone call)  Visited with family of 29 67/62 weeks old AGA NICU female; FOB came to visit and asked LC to give mom a call to check on her pumping status, she's not pumping as much as she used to. Spoke with Dawn Krause over the phone, she reported she went back to work about a week ago and she hasn't been pumping consistently the 3-4 days/week while on her 7 hours shift at work. She has the pump from [facility] and she just can't find the time to pump while at work. She voiced that her mother will be bringing her a wearable pump next week to take to work so she can start pumping consistently on a daily basis. She has noticed that her supply went down; she used to get 8 to 12 4 oz. bottles/24 hours and now she's only getting 4 bottles/24 hours. She also requested assistance to take baby to breast, she'll be back on 06/21/2022. Revised strategies to increase supply and the importance of consistent pumping.  Objective Infant data: Mother's Current Feeding Choice: Breast Milk  Infant feeding assessment Scale for Readiness: 1 Scale for Quality: 3  Maternal data: [id number]  Vaginal, Spontaneous Pumping frequency: 4 times/24 hours on average; she's not pumping when she's at work Pumped volume: 120 mL Flange Size: 24 WIC Program: Yes WIC Referral Sent?: Yes Pump: Personal (WIC pump)  Assessment Infant: In NICU  Maternal: Milk volume: Low  Intervention/Plan Interventions: Education Tools: Pump; Flanges Pump Education: Setup, frequency, and cleaning; Milk Storage  Plan of care: Encouraged to start bilateral pumping consistently every 3 hours, ideally 6-8 pumping sessions/24 hours She'll start power pumping in the AM She'll call NICU LC for latch  assistance on 06/21/2022 to take baby to breast   FOB present and supportive. All questions and concerns answered, family to contact [facility] services PRN  Consult Status: NICU follow-up  NICU Follow-up type: Assist with IDF-2 (Mother does not need to pre-pump before breastfeeding); Weekly NICU follow up   [place] 06/19/2022, 3:52 PM

## 2022-06-29 ENCOUNTER — Ambulatory Visit (INDEPENDENT_AMBULATORY_CARE_PROVIDER_SITE_OTHER): Payer: Medicaid Other | Admitting: Obstetrics & Gynecology

## 2022-06-29 NOTE — Progress Notes (Signed)
    [place] Partum Visit Note  Dawn Krause is a 22 y.o. [phone] female who presents for a postpartum visit. She is 6 weeks postpartum following a normal spontaneous vaginal delivery.  I have fully reviewed the prenatal and intrapartum course. The delivery was at 33.1 gestational weeks.  Anesthesia: none. Postpartum course has been good. Baby is still in NICU. Baby is feeding by breast. Bleeding no bleeding. Bowel function is normal. Bladder function is normal. Patient is not sexually active. Contraception method is abstinence.   Postpartum depression screening: negative, score 0.    The pregnancy intention screening data noted above was reviewed. Potential methods of contraception were discussed. The patient elected to proceed with No data recorded.    Health Maintenance Due  Topic Date Due   COVID-19 Vaccine (1) Never done   DTaP/Tdap/Td (1 - Tdap) Never done    The following portions of the patient's history were reviewed and updated as appropriate: allergies, current medications, past family history, past medical history, past social history, past surgical history, and problem list.  Review of Systems Pertinent items are noted in HPI.  Objective:  LMP 09/10/2021    General:  alert, cooperative, and no distress   Breasts:  not indicated  Lungs:   Heart:    Abdomen: Non-distended    Wound   GU exam:  not indicated       Assessment:    There are no diagnoses linked to this encounter.  normal postpartum exam.   Plan:   Essential components of care per ACOG recommendations:  1.  Mood and well being: Patient with negative depression screening today. Reviewed local resources for support.  - Patient tobacco use? No.   - hx of drug use? No.    2. Infant care and feeding:  -Patient currently breastmilk feeding? Yes. Reviewed importance of draining breast regularly to support lactation.  -Social determinants of health (SDOH) reviewed in EPIC. No concerns  3. Sexuality,  contraception and birth spacing - Patient does not want a pregnancy in the next year.  Desired family size is 4 children.  - Reviewed reproductive life planning. Reviewed contraceptive methods based on pt preferences and effectiveness.  Patient desired Female Condom today.   - Discussed birth spacing of 18 months  4. Sleep and fatigue -Encouraged family/partner/community support of 4 hrs of uninterrupted sleep to help with mood and fatigue  5. Physical Recovery  - Discussed patients delivery and complications. She describes her labor as good. - Patient had a Vaginal, no problems at delivery. Patient had  no  laceration. - Patient has urinary incontinence? No. - Patient is safe to resume physical and sexual activity  6.  Health Maintenance - HM due items addressed Yes - Last pap smear  Diagnosis  Date Value Ref Range Status  10/19/2020   Final   - Negative for Intraepithelial Lesions or Malignancy (NILM)  10/19/2020 - Benign reactive/reparative changes  Final   Pap smear not done at today's visit.  -Breast Cancer screening indicated? No.   7. Chronic Disease/Pregnancy Condition follow up: None  - PCP follow up  Woodroe Mode, MD [facility] [place]

## 2022-07-02 ENCOUNTER — Ambulatory Visit: Payer: Self-pay

## 2022-07-02 NOTE — Lactation Note (Signed)
This note was copied from a baby'Krause chart.  NICU Lactation Consultation Note  Patient Name: Dawn Krause [id number] Date: 07/02/2022 Age:23 wk.o.  Subjective Reason for consult: Weekly NICU follow-up; NICU baby; Term  Visited with family of 59 89/71 weeks old AGA NICU female; Ms. Fennell is a P4 and has some experience breastfeeding. Her plan is to do both, direct breastfeeding along with pumping and bottle feeding. She reports her supply has increased since she started pumping more often, praised her for her efforts. Parents are taking baby "Dawn Krause" home today. Reviewed discharge education and the importance of continuing pumping after feedings at the breast to protect her supply. She politely declined a [place] OP referral at this time, she voiced she'll F/U with her local [facility] office. She'll be getting her wearable pump on Tuesday; family is working on moving. FOB present, all questions and concerns answered, family to contact [facility] services PRN.  Objective Infant data: Mother'Krause Current Feeding Choice: Breast Milk  Infant feeding assessment Scale for Readiness: 1 Scale for Quality: 2  Maternal data: [id number]  Vaginal, Spontaneous Pumping frequency: 5-6 times/24 hours Pumped volume: 240 mL Flange Size: 24 WIC Program: Yes WIC Referral Sent?: Yes Pump: Personal (WIC pump)  Assessment Infant: Feeding Status: Ad lib  Maternal: Milk volume: Normal  Intervention/Plan Interventions: Breast feeding basics reviewed; DEBP; Education  Tools: Pump; Flanges Pump Education: Setup, frequency, and cleaning  Plan: Consult Status: Complete   Dawn Krause Dawn Krause 07/02/2022, 6:25 PM

## 2022-08-09 ENCOUNTER — Other Ambulatory Visit: Payer: Self-pay | Admitting: Obstetrics and Gynecology

## 2022-12-05 ENCOUNTER — Encounter: Payer: Self-pay | Admitting: Obstetrics and Gynecology

## 2022-12-05 ENCOUNTER — Ambulatory Visit (INDEPENDENT_AMBULATORY_CARE_PROVIDER_SITE_OTHER): Payer: Medicaid Other

## 2022-12-05 ENCOUNTER — Ambulatory Visit (INDEPENDENT_AMBULATORY_CARE_PROVIDER_SITE_OTHER): Payer: Self-pay | Admitting: Licensed Clinical Social Worker

## 2022-12-05 VITALS — BP 114/75 | HR 94 | Ht 62.0 in | Wt 122.0 lb

## 2022-12-05 DIAGNOSIS — F329 Major depressive disorder, single episode, unspecified: Secondary | ICD-10-CM

## 2022-12-05 DIAGNOSIS — N912 Amenorrhea, unspecified: Secondary | ICD-10-CM

## 2022-12-05 DIAGNOSIS — Z3201 Encounter for pregnancy test, result positive: Secondary | ICD-10-CM

## 2022-12-05 LAB — POCT URINE PREGNANCY: Preg Test, Ur: POSITIVE — AB

## 2022-12-05 NOTE — Progress Notes (Signed)
Dawn Krause here for a UPT. Pt had a positive upt at home. LMP is  around 10/11/2022.  Pt informed me of recent loss of baby. Pt informed me she has not disclosed this information with many people but is ready to discuss. Referred pt to Laureen Ochs, LCSW.  UPT in office Positive.   Reviewed medications and informed patient to start taking a prenatal vitamin. Pt to follow up for New OB intake at [facility].

## 2022-12-12 NOTE — BH Specialist Note (Signed)
Integrated Behavioral Health Initial In-Person Visit  MRN: [id number] Name: Dawn Krause  Number of Integrated Behavioral Health Clinician visits: 1 Session Start time:   2:30pm Session End time: 3:10pm Total time in minutes: 40 mins in person at [facility]   Types of Service: Individual psychotherapy  Interpretor:No. Interpretor Name and Language: none   Warm Hand Off Completed.        Subjective: Dawn Krause is a 22 y.o. female accompanied by na Patient was referred by Dawn Civatte RN for grief. Patient reports the following symptoms/concerns: newborn passed away Duration of problem: May 2024; Severity of problem: mild  Objective: Mood: Depressed and Affect: Appropriate Risk of harm to self or others: No plan to harm self or others  Life Context: Family and Social: lives with partner  School/Work: [employer]  Self-Care: n/a  Life Changes: new pregnancy  Patient and/or Family's Strengths/Protective Factors: Concrete supports in place (healthy food, safe environments, etc.)  Goals Addressed: Patient will: Reduce symptoms of: depression Increase knowledge and/or ability of: coping skills  Demonstrate ability to: Increase healthy adjustment to current life circumstances  Progress towards Goals: Ongoing  Interventions: Interventions utilized: Supportive Counseling  Standardized Assessments completed: Not Needed  Patient and/or Family Response: Dawn Krause reports infant son passed away memorial day weekend.    Assessment: Patient currently experiencing major depressive disorder.   Patient may benefit from integrated behavioral health.  Plan: Follow up with behavioral health clinician on : as needed  Behavioral recommendations: consider grief support group, prioritize self care and memorialize son when ready Referral(s): Integrated [employer] (In Clinic) "From scale of 1-10, how likely are you to follow plan?":  Dawn Saxon, LCSW

## 2023-01-09 ENCOUNTER — Ambulatory Visit (INDEPENDENT_AMBULATORY_CARE_PROVIDER_SITE_OTHER): Payer: Medicaid Other | Admitting: *Deleted

## 2023-01-09 ENCOUNTER — Other Ambulatory Visit (INDEPENDENT_AMBULATORY_CARE_PROVIDER_SITE_OTHER): Payer: Medicaid Other

## 2023-01-09 ENCOUNTER — Other Ambulatory Visit (HOSPITAL_COMMUNITY)
Admission: RE | Admit: 2023-01-09 | Discharge: 2023-01-09 | Disposition: A | Discharge status: Home / Self Care | Payer: Medicaid Other | Source: Ambulatory Visit | Attending: Obstetrics and Gynecology | Admitting: Obstetrics and Gynecology

## 2023-01-09 VITALS — BP 109/71 | HR 73 | Wt 118.8 lb

## 2023-01-09 DIAGNOSIS — O3680X0 Pregnancy with inconclusive fetal viability, not applicable or unspecified: Secondary | ICD-10-CM

## 2023-01-09 DIAGNOSIS — O219 Vomiting of pregnancy, unspecified: Secondary | ICD-10-CM

## 2023-01-09 DIAGNOSIS — Z1339 Encounter for screening examination for other mental health and behavioral disorders: Secondary | ICD-10-CM | POA: Diagnosis not present

## 2023-01-09 DIAGNOSIS — O0991 Supervision of high risk pregnancy, unspecified, first trimester: Secondary | ICD-10-CM | POA: Diagnosis not present

## 2023-01-09 DIAGNOSIS — Z3A10 10 weeks gestation of pregnancy: Secondary | ICD-10-CM | POA: Diagnosis not present

## 2023-01-09 DIAGNOSIS — O0990 Supervision of high risk pregnancy, unspecified, unspecified trimester: Secondary | ICD-10-CM | POA: Insufficient documentation

## 2023-01-09 DIAGNOSIS — Z3481 Encounter for supervision of other normal pregnancy, first trimester: Secondary | ICD-10-CM

## 2023-01-09 MED ORDER — BLOOD PRESSURE KIT DEVI
1.0000 | 0 refills | Status: DC
Start: 2023-01-09 — End: 2023-07-21

## 2023-01-09 MED ORDER — VITAFOL GUMMIES 3.33-0.333-34.8 MG PO CHEW
3.0000 | CHEWABLE_TABLET | Freq: Every day | ORAL | 12 refills | Status: AC
Start: 2023-01-09 — End: ?

## 2023-01-09 MED ORDER — PROMETHAZINE HCL 25 MG PO TABS
25.0000 mg | ORAL_TABLET | Freq: Four times a day (QID) | ORAL | 1 refills | Status: DC | PRN
Start: 2023-01-09 — End: 2023-07-21

## 2023-01-09 MED ORDER — GOJJI WEIGHT SCALE MISC
1.0000 | 0 refills | Status: DC
Start: 2023-01-09 — End: 2023-07-21

## 2023-01-09 MED ORDER — DOXYLAMINE-PYRIDOXINE 10-10 MG PO TBEC
2.0000 | DELAYED_RELEASE_TABLET | Freq: Every day | ORAL | 5 refills | Status: DC
Start: 2023-01-09 — End: 2023-04-16

## 2023-01-09 NOTE — Progress Notes (Signed)
New OB Intake  I connected with Dawn Krause  on 01/09/23 at  1:10 PM EDT by In Person Visit and verified that I am speaking with the correct person using two identifiers. Nurse is located at [facility] and pt is located at [facility].  I discussed the limitations, risks, security and privacy concerns of performing an evaluation and management service by telephone and the availability of in person appointments. I also discussed with the patient that there may be a patient responsible charge related to this service. The patient expressed understanding and agreed to proceed.  I explained I am completing New OB Intake today. We discussed EDD of 07/18/2023, by Last Menstrual Period. Pt is [id number]. I reviewed her allergies, medications and Medical/Surgical/OB history.    Patient Active Problem List   Diagnosis Date Noted   Anemia     Concerns addressed today  Delivery Plans Plans to deliver at [facility]. Discussed the nature of our practice with multiple providers including residents and students. Due to the size of the practice, the delivering provider may not be the same as those providing prenatal care.   Patient is not interested in water birth. Offered upcoming OB visit with CNM to discuss further.  MyChart/Babyscripts MyChart access verified. I explained pt will have some visits in office and some virtually. Babyscripts instructions given and order placed. Patient verifies receipt of registration text/e-mail. Account successfully created and app downloaded.  Blood Pressure Cuff/Weight Scale Patient has private insurance; instructed to purchase blood pressure cuff and bring to first prenatal appt. Explained after first prenatal appt pt will check weekly and document in Babyscripts. Patient does not have weight scale; order sent to [facility], patient may track weight weekly in Babyscripts.  Anatomy US Explained first scheduled [place] will be around 19 weeks. Anatomy US scheduled for TBD at  TBD.  Interested in [place]? If yes, send referral and doula dot phrase.   Is patient a candidate for Babyscripts Optimization? No - High Risk  First visit review I reviewed new OB appt with patient. Explained pt will be seen by Gerrit Heck, CNM at first visit. Discussed Avelina Laine genetic screening with patient. Requests Panorama. Routine prenatal labs  collected at today's visit.    Last Pap Diagnosis  Date Value Ref Range Status  10/19/2020   Final   - Negative for Intraepithelial Lesions or Malignancy (NILM)  10/19/2020 - Benign reactive/reparative changes  Final    Harrel Lemon, RN 01/09/2023  1:25 PM

## 2023-01-09 NOTE — Patient Instructions (Signed)

## 2023-01-10 LAB — CBC/D/PLT+RPR+RH+ABO+RUBIGG...
Antibody Screen: NEGATIVE
Basophils Absolute: 0 10*3/uL (ref 0.0–0.2)
Basos: 0 %
EOS (ABSOLUTE): 0.6 10*3/uL — ABNORMAL HIGH (ref 0.0–0.4)
Eos: 7 %
HCV Ab: NONREACTIVE
HIV Screen 4th Generation wRfx: NONREACTIVE
Hematocrit: 39.9 % (ref 34.0–46.6)
Hemoglobin: 13.7 g/dL (ref 11.1–15.9)
Hepatitis B Surface Ag: NEGATIVE
Immature Grans (Abs): 0 10*3/uL (ref 0.0–0.1)
Immature Granulocytes: 0 %
Lymphocytes Absolute: 2.5 10*3/uL (ref 0.7–3.1)
Lymphs: 29 %
MCH: 30.4 pg (ref 26.6–33.0)
MCHC: 34.3 g/dL (ref 31.5–35.7)
MCV: 89 fL (ref 79–97)
Monocytes Absolute: 0.4 10*3/uL (ref 0.1–0.9)
Monocytes: 5 %
Neutrophils Absolute: 5.3 10*3/uL (ref 1.4–7.0)
Neutrophils: 59 %
Platelets: 195 10*3/uL (ref 150–450)
RBC: 4.5 x10E6/uL (ref 3.77–5.28)
RDW: 12.7 % (ref 11.7–15.4)
RPR Ser Ql: NONREACTIVE
Rh Factor: POSITIVE
Rubella Antibodies, IGG: 1.68 {index} (ref >0.99)
WBC: 8.9 10*3/uL (ref 3.4–10.8)

## 2023-01-10 LAB — HCV INTERPRETATION

## 2023-01-11 LAB — CERVICOVAGINAL ANCILLARY ONLY
Chlamydia: NEGATIVE
Comment: NEGATIVE
Comment: NORMAL
Neisseria Gonorrhea: NEGATIVE

## 2023-01-13 LAB — URINE CULTURE, OB REFLEX

## 2023-01-13 LAB — CULTURE, OB URINE

## 2023-01-14 LAB — PANORAMA PRENATAL TEST FULL PANEL:PANORAMA TEST PLUS 5 ADDITIONAL MICRODELETIONS: FETAL FRACTION: 11.9

## 2023-01-18 MED ORDER — CEPHALEXIN 500 MG PO CAPS: 500.0000 mg | ORAL_CAPSULE | Freq: Four times a day (QID) | ORAL | 2 refills | Status: DC

## 2023-01-18 NOTE — Addendum Note (Signed)
Addended by: Catalina Antigua on: 01/18/2023 08:49 AM   Modules accepted: Orders

## 2023-02-14 ENCOUNTER — Ambulatory Visit: Payer: Self-pay | Admitting: Licensed Clinical Social Worker

## 2023-02-28 DIAGNOSIS — O09899 Supervision of other high risk pregnancies, unspecified trimester: Secondary | ICD-10-CM | POA: Insufficient documentation

## 2023-03-12 ENCOUNTER — Other Ambulatory Visit: Payer: Self-pay | Admitting: Obstetrics and Gynecology

## 2023-03-12 DIAGNOSIS — O0990 Supervision of high risk pregnancy, unspecified, unspecified trimester: Secondary | ICD-10-CM

## 2023-03-13 ENCOUNTER — Other Ambulatory Visit: Payer: Medicaid Other

## 2023-03-13 ENCOUNTER — Ambulatory Visit: Payer: Medicaid Other

## 2023-03-13 DIAGNOSIS — O09899 Supervision of other high risk pregnancies, unspecified trimester: Secondary | ICD-10-CM

## 2023-04-09 ENCOUNTER — Ambulatory Visit: Payer: Medicaid Other

## 2023-04-09 ENCOUNTER — Other Ambulatory Visit: Payer: Medicaid Other

## 2023-04-13 ENCOUNTER — Encounter: Payer: Medicaid Other | Admitting: Obstetrics and Gynecology

## 2023-04-13 DIAGNOSIS — O0990 Supervision of high risk pregnancy, unspecified, unspecified trimester: Secondary | ICD-10-CM

## 2023-04-16 ENCOUNTER — Encounter: Payer: Self-pay | Admitting: Obstetrics & Gynecology

## 2023-04-16 ENCOUNTER — Ambulatory Visit (INDEPENDENT_AMBULATORY_CARE_PROVIDER_SITE_OTHER): Payer: Medicaid Other | Admitting: Obstetrics & Gynecology

## 2023-04-16 VITALS — BP 123/84 | HR 116 | Wt 136.0 lb

## 2023-04-16 DIAGNOSIS — Z3A24 24 weeks gestation of pregnancy: Secondary | ICD-10-CM | POA: Diagnosis not present

## 2023-04-16 DIAGNOSIS — O0992 Supervision of high risk pregnancy, unspecified, second trimester: Secondary | ICD-10-CM | POA: Diagnosis not present

## 2023-04-16 DIAGNOSIS — O0990 Supervision of high risk pregnancy, unspecified, unspecified trimester: Secondary | ICD-10-CM

## 2023-04-16 MED ORDER — FERROUS SULFATE 325 (65 FE) MG PO TABS: ORAL_TABLET | ORAL | 3 refills | Status: DC

## 2023-04-16 MED ORDER — SERTRALINE HCL 25 MG PO TABS
ORAL_TABLET | ORAL | 1 refills | Status: DC
Start: 2023-04-16 — End: 2023-05-03

## 2023-04-16 MED ORDER — DOCUSATE SODIUM 100 MG PO CAPS: 100.0000 mg | ORAL_CAPSULE | Freq: Two times a day (BID) | ORAL | 2 refills | Status: DC | PRN

## 2023-04-16 NOTE — Progress Notes (Signed)
Subjective:    Dawn Krause is a [id number] [redacted]w[redacted]d being seen today for her first obstetrical visit.  Her obstetrical history is significant for  preterm delivery last pregnancy--baby died . Patient does intend to breast feed. Pregnancy history fully reviewed.  Patient reports no complaints.  Vitals:   04/16/23 1407  BP: (!) 133/91  Pulse: (!) 105  Weight: 136 lb (61.7 kg)    HISTORY: OB History  Gravida Para Term Preterm AB Living  5 4 3 1  3   SAB IAB Ectopic Multiple Live Births     0 4    # Outcome Date GA Lbr Len/2nd Weight Sex Type Anes PTL Lv  5 Current           4 Preterm 05/18/22 [redacted]w[redacted]d 09:40 / 00:01 4 lb 15.7 oz (2.26 kg) M Vag-Spont None Y DEC  3 Term 04/15/21 [redacted]w[redacted]d    Vag-Spont   LIV  2 Term 03/16/20 [redacted]w[redacted]d 08:36 / 00:09 7 lb 2.5 oz (3.246 kg) M Vag-Spont EPI  LIV  1 Term 12/15/18 [redacted]w[redacted]d   F Vag-Spont  N LIV   Past Medical History:  Diagnosis Date   Anemia    Infection    UTI   Past Surgical History:  Procedure Laterality Date   NO PAST SURGERIES     Family History  Problem Relation Age of Onset   Diabetes Mother    Hypertension Mother    Heart Problems Father    Cancer Maternal Grandfather    Diabetes Maternal Grandfather      Exam    Vitals:   04/16/23 1407 04/16/23 1437  BP: (!) 133/91 123/84  Pulse: (!) 105 (!) 116  Weight: 136 lb (61.7 kg)     Vitals:  WNL--was nervous and was rushing to get to office when BP taken.  General appearance: alert, cooperative and no distress  HEENT: Normocephalic, without obvious abnormality, atraumatic Eyes: negative Throat: lips, mucosa, and tongue normal; teeth and gums normal  Respiratory: Clear to auscultation bilaterally  CV: Regular rate and rhythm  Breasts:  Normal appearance, no masses or tenderness, no nipple retraction or dimpling  GI: Soft, non-tender; bowel sounds normal; no masses,  no organomegaly  GU: Declined exam  Vagina: Declined exam  Cervix: Declined exam  Uterus:  Gravid about 27 cm   Adnexa: Declined exam  Musculoskeletal: No edema, redness or tenderness in the calves or thighs  Skin: No lesions or rash  Lymphatic: Axillary adenopathy: none     Psychiatric: Normal mood and behavior     Assessment:    Pregnancy: [id number] Patient Active Problem List   Diagnosis Date Noted   Short interval between pregnancies affecting pregnancy, antepartum 02/28/2023   History of preterm delivery, currently pregnant-hx PTL/PTD @33 .1 wks-recent infant demise 02/28/2023   Supervision of high risk pregnancy, antepartum 01/09/2023   Anemia         Plan:   Initial labs drawn. Prenatal vitamins. Problem list reviewed and updated. Genetic Screening lardy drawn at [facility]--low risk Ultrasound at [facility] scheudled--need to confirm dates; pt was not sexually active during the time conception occurred based on [place]. Hx of PTL--reviewed signs and symptoms.  D/C summary does not indicate infection or other reason for preterm delivery. SIDS with preterm delivery on memorial day 2024.  Anemia--was anemic at delivery and not improved.  Looking for [place] or anemia work up in the past.  Ferritin was low in 2022.   Follow up in 2 weeks    Dawn Krause  04/16/2023   

## 2023-05-02 NOTE — L&D Delivery Note (Signed)
 OB/GYN Faculty Practice Delivery Note  Dawn Krause is a 23 y.o. [id number] s/p TSVD at [redacted]w[redacted]d. She was admitted for SOL.   ROM: 10h 39m with clear fluid GBS Status: GBS neg Maximum Maternal Temperature: 98.6 degrees F  Labor Progress: SOL, SROM at 1700 with a small leak, AROM due to bag noted on SVE and pitocin started to increase frequency and strength of contractions, shortly after starting pitocin pt progressed to 10cm   Delivery Date/Time: 07/19/23 at 0227 Delivery: Called to room and patient was complete and pushing. Head delivered LOA position with compound hand. No nuchal cord present. Shoulder and body delivered in usual fashion. Infant with spontaneous cry, placed on mother's abdomen, dried and stimulated. Cord clamped x 2 after 1-minute delay, and cut by SNM. Cord blood drawn. Placenta delivered spontaneously with gentle cord traction. Fundus firm with massage and IV Pitocin administered. Labia, perineum, vagina, and cervix inspected and found to be intact.   Placenta: Spontaneous, Intact, L&D Complications: None Lacerations: None, intact EBL: 189 Analgesia: Epidural  Postpartum Planning [x ] message to sent to schedule follow-up  [x ] vaccines UTD  Infant: Baby Boy  APGARs 8-9  Pending weight  Herminio Commons, Student-MidWife  07/19/2023 2:40 AM

## 2023-05-03 ENCOUNTER — Ambulatory Visit: Payer: Medicaid Other | Admitting: Obstetrics and Gynecology

## 2023-05-03 ENCOUNTER — Ambulatory Visit (INDEPENDENT_AMBULATORY_CARE_PROVIDER_SITE_OTHER): Payer: Medicaid Other | Admitting: Obstetrics and Gynecology

## 2023-05-03 ENCOUNTER — Encounter: Payer: Self-pay | Admitting: Obstetrics and Gynecology

## 2023-05-03 VITALS — BP 125/83 | HR 90 | Wt 140.0 lb

## 2023-05-03 DIAGNOSIS — F32A Depression, unspecified: Secondary | ICD-10-CM

## 2023-05-03 DIAGNOSIS — O0990 Supervision of high risk pregnancy, unspecified, unspecified trimester: Secondary | ICD-10-CM

## 2023-05-03 DIAGNOSIS — Z3A26 26 weeks gestation of pregnancy: Secondary | ICD-10-CM

## 2023-05-03 DIAGNOSIS — O09899 Supervision of other high risk pregnancies, unspecified trimester: Secondary | ICD-10-CM

## 2023-05-03 MED ORDER — ESCITALOPRAM OXALATE 10 MG PO TABS: ORAL_TABLET | ORAL | 11 refills | Status: DC

## 2023-05-03 NOTE — Progress Notes (Signed)
 Visit rescheduled

## 2023-05-03 NOTE — Progress Notes (Signed)
 Pt concerned about taking Zoloft

## 2023-05-03 NOTE — Progress Notes (Signed)
   PRENATAL VISIT NOTE  Subjective:  Dawn Krause is a 23 y.o. [phone] at [redacted]w[redacted]d being seen today for ongoing prenatal care.  She is currently monitored for the following issues for this high-risk pregnancy and has Supervision of high risk pregnancy, antepartum; Short interval between pregnancies affecting pregnancy, antepartum; and History of preterm delivery, currently pregnant-hx PTL/PTD @33 .1 wks-recent infant demise on their problem list.  Patient reports no complaints.  Contractions: Not present. Vag. Bleeding: None.  Movement: Present. Denies leaking of fluid.   The following portions of the patient's history were reviewed and updated as appropriate: allergies, current medications, past family history, past medical history, past social history, past surgical history and problem list.   Objective:   Vitals:   05/03/23 1318  BP: 125/83  Pulse: 90  Weight: 140 lb (63.5 kg)   Fetal Status: Fetal Heart Rate (bpm): 140   Movement: Present     General:  Alert, oriented and cooperative. Patient is in no acute distress.  Skin: Skin is warm and dry. No rash noted.   Cardiovascular: Normal heart rate noted  Respiratory: Normal respiratory effort, no problems with respiration noted  Abdomen: Soft, gravid, appropriate for gestational age.  Pain/Pressure: Absent      Assessment and Plan:  Pregnancy: [id number] at [redacted]w[redacted]d 1. Supervision of high risk pregnancy, antepartum (Primary) 2. [redacted] weeks gestation of pregnancy Anatomy/dating US  scheduled 1/9 2h GTT today - pt is fasting, Fhx T2DM Discussed third tri labs and tdap next appt, defer today since she just got her NOB labs 2 weeks ago  3. History of preterm delivery, currently pregnant-hx PTL/PTD @33 .1 wks-recent infant demise 4. Short interval between pregnancies affecting pregnancy, antepartum Anatomy US  scheduled 1/9  Will f/u MFM recs regarding strategies to reduce risk of PTB though may be limited due to advanced gestational age at time of  NOB  5. Depression Previously stable on lexapro . Had discussed switching to zoloft  last appt but got worried when she read about it on the internet. Discussed that zoloft  is most studied but that it would be reasonable to continue lexapro . Discussed risks of neonatal withdrawal syndrome, but there are significant risks to untreated maternal mental health conditions. After discussion, she would like to resume lexapro . Rx sent  Preterm labor symptoms and general obstetric precautions including but not limited to vaginal bleeding, contractions, leaking of fluid and fetal movement were reviewed in detail with the patient. Please refer to After Visit Summary for other counseling recommendations.   Return in about 4 weeks (around 05/31/2023) for ROB at 30 weeks with CBC, HIV, RPR and tdap.  Future Appointments  Date Time Provider Department Center  05/10/2023  2:15 PM [facility] NURSE [facility]  05/10/2023  2:30 PM [facility] US[facility]  05/23/2023  9:00 AM Geralene Kaiser, MD [facility] None  06/04/2023  2:00 PM Sheets, Fonda, LCSW [facility] None   Kieth JAYSON Carolin, MD

## 2023-05-04 LAB — GLUCOSE TOLERANCE, 2 HOURS W/ 1HR
Glucose, 1 hour: 103 mg/dL (ref 70–179)
Glucose, 2 hour: 118 mg/dL (ref 70–152)
Glucose, Fasting: 85 mg/dL (ref 70–91)

## 2023-05-06 LAB — CULTURE, OB URINE

## 2023-05-06 LAB — URINE CULTURE, OB REFLEX

## 2023-05-10 ENCOUNTER — Other Ambulatory Visit: Payer: Self-pay

## 2023-05-10 ENCOUNTER — Other Ambulatory Visit: Payer: Self-pay | Admitting: *Deleted

## 2023-05-10 ENCOUNTER — Ambulatory Visit: Payer: Medicaid Other | Attending: Obstetrics and Gynecology | Admitting: *Deleted

## 2023-05-10 ENCOUNTER — Ambulatory Visit: Payer: Medicaid Other

## 2023-05-10 VITALS — BP 115/61 | HR 88

## 2023-05-10 DIAGNOSIS — O09899 Supervision of other high risk pregnancies, unspecified trimester: Secondary | ICD-10-CM

## 2023-05-10 DIAGNOSIS — O09293 Supervision of pregnancy with other poor reproductive or obstetric history, third trimester: Secondary | ICD-10-CM | POA: Insufficient documentation

## 2023-05-10 DIAGNOSIS — O09212 Supervision of pregnancy with history of pre-term labor, second trimester: Secondary | ICD-10-CM

## 2023-05-10 DIAGNOSIS — Z3A33 33 weeks gestation of pregnancy: Secondary | ICD-10-CM | POA: Diagnosis not present

## 2023-05-10 DIAGNOSIS — O321XX0 Maternal care for breech presentation, not applicable or unspecified: Secondary | ICD-10-CM | POA: Insufficient documentation

## 2023-05-10 DIAGNOSIS — Z363 Encounter for antenatal screening for malformations: Secondary | ICD-10-CM | POA: Insufficient documentation

## 2023-05-10 DIAGNOSIS — Z362 Encounter for other antenatal screening follow-up: Secondary | ICD-10-CM

## 2023-05-10 DIAGNOSIS — O0990 Supervision of high risk pregnancy, unspecified, unspecified trimester: Secondary | ICD-10-CM

## 2023-05-10 DIAGNOSIS — Z3A27 27 weeks gestation of pregnancy: Secondary | ICD-10-CM | POA: Diagnosis not present

## 2023-05-14 ENCOUNTER — Encounter (HOSPITAL_COMMUNITY): Payer: Self-pay | Admitting: Obstetrics & Gynecology

## 2023-05-14 ENCOUNTER — Inpatient Hospital Stay (HOSPITAL_COMMUNITY)
Admission: AD | Admit: 2023-05-14 | Discharge: 2023-05-14 | Disposition: A | Discharge status: Home / Self Care | Payer: Medicaid Other | Attending: Obstetrics & Gynecology | Admitting: Obstetrics & Gynecology

## 2023-05-14 ENCOUNTER — Telehealth: Payer: Self-pay

## 2023-05-14 ENCOUNTER — Other Ambulatory Visit: Payer: Self-pay

## 2023-05-14 DIAGNOSIS — Z3A28 28 weeks gestation of pregnancy: Secondary | ICD-10-CM | POA: Diagnosis not present

## 2023-05-14 DIAGNOSIS — Z3A38 38 weeks gestation of pregnancy: Secondary | ICD-10-CM | POA: Diagnosis not present

## 2023-05-14 DIAGNOSIS — O26893 Other specified pregnancy related conditions, third trimester: Secondary | ICD-10-CM | POA: Insufficient documentation

## 2023-05-14 DIAGNOSIS — O09899 Supervision of other high risk pregnancies, unspecified trimester: Secondary | ICD-10-CM

## 2023-05-14 DIAGNOSIS — A084 Viral intestinal infection, unspecified: Secondary | ICD-10-CM | POA: Insufficient documentation

## 2023-05-14 DIAGNOSIS — O09213 Supervision of pregnancy with history of pre-term labor, third trimester: Secondary | ICD-10-CM | POA: Insufficient documentation

## 2023-05-14 DIAGNOSIS — O26892 Other specified pregnancy related conditions, second trimester: Secondary | ICD-10-CM | POA: Diagnosis present

## 2023-05-14 MED ORDER — ONDANSETRON 4 MG PO TBDP
4.0000 mg | ORAL_TABLET | Freq: Once | ORAL | Status: AC
  Administered 2023-05-14: 4 mg via ORAL
  Filled 2023-05-14: qty 1

## 2023-05-14 MED ORDER — ONDANSETRON 4 MG PO TBDP: 4.0000 mg | ORAL_TABLET | Freq: Four times a day (QID) | ORAL | 0 refills | Status: DC | PRN

## 2023-05-14 NOTE — MAU Provider Note (Signed)
 History     [id number]  Arrival date and time: 05/14/23 1815    Chief Complaint  Patient presents with   Abdominal Pain   Blurred Vision   Decreased Fetal Movement     HPI Dawn Krause is a 23 y.o. at [redacted]w[redacted]d with PMHx notable for preterm delivery at 33 weeks, short interval pregnancy, who presents for multiple symptoms.   Reports that around 0600 she suddenly had a terrible stomach cramp Went to the bathroom and had diarrhea, after which she continue to have cramping Then developed nausea and went to lay down Laying down the cramping got even worse Then began to feel weak, had blurred vision, her fingers felt numb Tried to improve her symptoms with a heating pad which did give her some relief Eventually was able to rest for a bit and fell asleep When she woke up her vision had normalized, but still felt nauseous and weak Also then noticed that fetal movement after that was decreased Called OB and instructed to come to MAU for an evaluation Denies any vaginal bleeding or big gush of fluid No contractions, states pain is more like period cramps Cramps last for a few minutes Has not taken any other meds Has not had anything to eat today Has had some water today Currently barely has any numbness or tingling in her hands Still feels nauseous  A/Positive/-- (09/10 1443)  OB History     Gravida  5   Para  4   Term  3   Preterm  1   AB      Living  3      SAB      IAB      Ectopic      Multiple  0   Live Births  4           Past Medical History:  Diagnosis Date   Anemia     Past Surgical History:  Procedure Laterality Date   NO PAST SURGERIES      Family History  Problem Relation Age of Onset   Diabetes Mother    Hypertension Mother    Heart Problems Father    Cancer Maternal Grandfather    Diabetes Maternal Grandfather     Social History   Socioeconomic History   Marital status: Single    Spouse name: Not on file   Number of  children: Not on file   Years of education: Not on file   Highest education level: Not on file  Occupational History   Not on file  Tobacco Use   Smoking status: Never   Smokeless tobacco: Never  Vaping Use   Vaping status: Never Used  Substance and Sexual Activity   Alcohol use: Not Currently   Drug use: Not Currently   Sexual activity: Yes    Birth control/protection: None  Other Topics Concern   Not on file  Social History Narrative   Not on file   Social Drivers of Health   Financial Resource Strain: Not on file  Food Insecurity: No Food Insecurity (05/18/2022)   Hunger Vital Sign    Worried About Running Out of Food in the Last Year: Never true    Ran Out of Food in the Last Year: Never true  Transportation Needs: No Transportation Needs (05/18/2022)   PRAPARE - Administrator, Civil Service (Medical): No    Lack of Transportation (Non-Medical): No  Physical Activity: Not on file  Stress: Not on file  Social Connections: Not on file  Intimate Partner Violence: Not At Risk (05/18/2022)   Humiliation, Afraid, Rape, and Kick questionnaire    Fear of Current or Ex-Partner: No    Emotionally Abused: No    Physically Abused: No    Sexually Abused: No    Allergies  Allergen Reactions   Gluten Meal Rash    No current facility-administered medications on file prior to encounter.   Current Outpatient Medications on File Prior to Encounter  Medication Sig Dispense Refill   escitalopram  (LEXAPRO ) 10 MG tablet Take 0.5 tablets (5 mg total) by mouth daily for 7 days, THEN 1 tablet (10 mg total) daily. 30 tablet 11   Prenatal Vit-Fe Phos-FA-Omega (VITAFOL  GUMMIES) 3.33-0.333-34.8 MG CHEW Chew 3 tablets by mouth daily. 90 tablet 12   promethazine  (PHENERGAN ) 25 MG tablet Take 1 tablet (25 mg total) by mouth every 6 (six) hours as needed for nausea or vomiting. 30 tablet 1   Blood Pressure Monitoring (BLOOD PRESSURE KIT) DEVI 1 Device by Does not apply route once a  week. (Patient not taking: Reported on 05/10/2023) 1 each 0   ferrous sulfate  325 (65 FE) MG tablet Take one tablet every OTHER morning with an acidic juice (Patient not taking: Reported on 05/10/2023) 30 tablet 3   Misc. Devices (GOJJI WEIGHT SCALE) MISC 1 Device by Does not apply route once a week. (Patient not taking: Reported on 05/03/2023) 1 each 0   triamcinolone cream (KENALOG) 0.1 % Apply 1 Application topically at bedtime as needed. (Patient not taking: Reported on 05/03/2023)       ROS Pertinent positives and negative per HPI, all others reviewed and negative  Physical Exam   BP 126/78   Pulse 88   Temp 98.6 F (37 C) (Oral)   Resp 16   Ht 5' 2 (1.575 m)   Wt 65.8 kg   LMP 10/11/2022 (Approximate)   SpO2 97%   BMI 26.52 kg/m   Patient Vitals for the past 24 hrs:  BP Temp Temp src Pulse Resp SpO2 Height Weight  05/14/23 1855 126/78 -- -- 88 -- 97 % -- --  05/14/23 1837 116/73 98.6 F (37 C) Oral 91 16 -- -- --  05/14/23 1832 -- -- -- -- -- -- 5' 2 (1.575 m) 65.8 kg    Physical Exam Vitals reviewed.  Constitutional:      General: She is not in acute distress.    Appearance: She is well-developed. She is not diaphoretic.  Eyes:     General: No scleral icterus. Pulmonary:     Effort: Pulmonary effort is normal. No respiratory distress.  Abdominal:     General: There is no distension.     Palpations: Abdomen is soft.     Tenderness: There is no abdominal tenderness. There is no guarding or rebound.  Skin:    General: Skin is warm and dry.  Neurological:     Mental Status: She is alert.     Coordination: Coordination normal.      Cervical Exam Dilation: Closed Effacement (%): Thick Station: Ballotable Exam by:: Liberty Seto,MD  Bedside Ultrasound Pt informed that the ultrasound is considered a limited OB ultrasound and is not intended to be a complete ultrasound exam.  Patient also informed that the ultrasound is not being completed with the intent of assessing  for fetal or placental anomalies or any pelvic abnormalities.  Explained that the purpose of today's ultrasound is to assess for   fetal well being in setting  of maternal anxiety and history of poor outcome .  Patient acknowledges the purpose of the exam and the limitations of the study.      My interpretation: cephalic presentation. AFI subjectively normal. Numerous fetal movements observed. Fetal breathing movement observed.   FHT Baseline: 140 bpm Variability: Good {> 6 bpm) Accelerations: Reactive Decelerations: Absent Uterine activity: None Cat: I  Labs No results found for this or any previous visit (from the past 24 hours).  Imaging No results found.  MAU Course  Procedures Lab Orders  No laboratory test(s) ordered today   Meds ordered this encounter  Medications   ondansetron  (ZOFRAN -ODT) disintegrating tablet 4 mg   ondansetron  (ZOFRAN -ODT) 4 MG disintegrating tablet    Sig: Take 1 tablet (4 mg total) by mouth every 6 (six) hours as needed for nausea.    Dispense:  20 tablet    Refill:  0   Imaging Orders  No imaging studies ordered today    MDM Moderate (Level 3-4)  Assessment and Plan  # Viral gastroenteritis #[redacted] weeks gestation of pregnancy Symptoms most c/w viral GI illness. Given zofran  and able to tolerate PO challenge. Conservative treatment.    #History of preterm delivery #History of neonatal loss Cervix external os 1-2 cm but internal os close, cervix long. No contractions on toco. Low suspicion for preterm labor.   #FWB FHT Cat I NST: Reactive   Dispo: discharged to home in stable condition    Donnice CHRISTELLA Carolus, MD/MPH 05/14/23 8:59 PM  Allergies as of 05/14/2023       Reactions   Gluten Meal Rash        Medication List     TAKE these medications    Blood Pressure Kit Devi 1 Device by Does not apply route once a week.   escitalopram  10 MG tablet Commonly known as: Lexapro  Take 0.5 tablets (5 mg total) by mouth daily for  7 days, THEN 1 tablet (10 mg total) daily. Start taking on: May 03, 2023   ferrous sulfate  325 (65 FE) MG tablet Take one tablet every OTHER morning with an acidic juice   Gojji Weight Scale Misc 1 Device by Does not apply route once a week.   ondansetron  4 MG disintegrating tablet Commonly known as: ZOFRAN -ODT Take 1 tablet (4 mg total) by mouth every 6 (six) hours as needed for nausea.   promethazine  25 MG tablet Commonly known as: PHENERGAN  Take 1 tablet (25 mg total) by mouth every 6 (six) hours as needed for nausea or vomiting.   triamcinolone cream 0.1 % Commonly known as: KENALOG Apply 1 Application topically at bedtime as needed.   Vitafol  Gummies 3.33-0.333-34.8 MG Chew Chew 3 tablets by mouth daily.

## 2023-05-14 NOTE — MAU Note (Signed)
 Tyrisha Benninger is a 23 y.o. at [redacted]w[redacted]d here in MAU reporting: lower abdominal cramping and intermittent blurry vision that started around 6 am this morning. She reports she put a heating pad on her stomach and the cramps subsided with the heating pad. However, that only worked for about 2 hours. She reports she hasn't taken any medication for it today. She also reports a pins and needles feeling in her hands. She hasn't felt her baby move as much as today, and reports they've moved approx. 10 times today. Denies VB and LOF.    Onset of complaint: 6 am Pain score: 7/10 abdomen; 3/10 hands Vitals:   05/14/23 1837  BP: 116/73  Pulse: 91  Resp: 16  Temp: 98.6 F (37 C)     FHT:147 Lab orders placed from triage: urine collected

## 2023-05-23 ENCOUNTER — Telehealth (HOSPITAL_COMMUNITY): Payer: Medicaid Other | Admitting: Psychiatry

## 2023-05-23 ENCOUNTER — Encounter (HOSPITAL_COMMUNITY): Payer: Self-pay

## 2023-05-30 ENCOUNTER — Ambulatory Visit: Payer: Medicaid Other | Admitting: Obstetrics and Gynecology

## 2023-05-30 NOTE — Progress Notes (Unsigned)
   PRENATAL VISIT NOTE  Subjective:  Dawn Krause is a 23 y.o. [phone] at [redacted]w[redacted]d being seen today for ongoing prenatal care.  She is currently monitored for the following issues for this high-risk pregnancy and has Supervision of high risk pregnancy, antepartum; Short interval between pregnancies affecting pregnancy, antepartum; and History of preterm delivery, currently pregnant-hx PTL/PTD @33 .1 wks-recent infant demise on their problem list.  Patient reports {[id number]}.   .  .   . Denies leaking of fluid.   The following portions of the patient's history were reviewed and updated as appropriate: allergies, current medications, past family history, past medical history, past social history, past surgical history and problem list.   Objective:  There were no vitals filed for this visit.  Fetal Status:           General:  Alert, oriented and cooperative. Patient is in no acute distress.  Skin: Skin is warm and dry. No rash noted.   Cardiovascular: Normal heart rate noted  Respiratory: Normal respiratory effort, no problems with respiration noted  Abdomen: Soft, gravid, appropriate for gestational age.        Pelvic: Cervical exam deferred        Extremities: Normal range of motion.     Mental Status: Normal mood and affect. Normal behavior. Normal judgment and thought content.   Assessment and Plan:  Pregnancy: [id number] at [redacted]w[redacted]d 1. Short interval between pregnancies affecting pregnancy, antepartum (Primary) Growth was normal on 1/9. Has f/u on 1/27  2. Supervision of high risk pregnancy, antepartum Other 28 wk labs today. 2 hr was normal.  Discussed flu and tdap vaccines - pt ***  3. [redacted] weeks gestation of pregnancy  Preterm labor symptoms and general obstetric precautions including but not limited to vaginal bleeding, contractions, leaking of fluid and fetal movement were reviewed in detail with the patient. Please refer to After Visit Summary for other counseling recommendations.    No follow-ups on file.  Future Appointments  Date Time Provider Department Center  05/31/2023  1:10 PM Milas Hock, MD [facility]  06/04/2023  2:00 PM Sheets, Ivin Booty, [place] [facility] None  06/14/2023  1:30 PM Lennart Pall, MD [facility]  06/28/2023  1:30 PM Lennart Pall, MD [facility]  06/28/2023  3:30 PM [facility]    Milas Hock, MD

## 2023-05-30 NOTE — Progress Notes (Deleted)
   PRENATAL VISIT NOTE  Subjective:  Dawn Krause is a 23 y.o. [phone] at [redacted]w[redacted]d being seen today for ongoing prenatal care.  She is currently monitored for the following issues for this high-risk pregnancy and has Supervision of high risk pregnancy, antepartum; Short interval between pregnancies affecting pregnancy, antepartum; and History of preterm delivery, currently pregnant-hx PTL/PTD @33 .1 wks-recent infant demise on their problem list.  Patient reports {sx:[id number]}.   .  .   . Denies leaking of fluid.   The following portions of the patient's history were reviewed and updated as appropriate: allergies, current medications, past family history, past medical history, past social history, past surgical history and problem list.   Objective:  There were no vitals filed for this visit.  Fetal Status:           General:  Alert, oriented and cooperative. Patient is in no acute distress.  Skin: Skin is warm and dry. No rash noted.   Cardiovascular: Normal heart rate noted  Respiratory: Normal respiratory effort, no problems with respiration noted  Abdomen: Soft, gravid, appropriate for gestational age.         Assessment and Plan:  Pregnancy: [id number] at [redacted]w[redacted]d 1. Supervision of high risk pregnancy, antepartum (Primary) 2. [redacted] weeks gestation of pregnancy CBC/HIV/RPR next visit*** Tdap today***  3. History of preterm delivery, currently pregnant-hx PTL/PTD @33 .1 wks-recent infant demise 4. Short interval between pregnancies affecting pregnancy, antepartum Normal anatomy US, CL 3.4 transabdominally  Growth [place] scheduled 2/27  5. Depression during pregnancy Restarted lexapro last visit - pt feeling ***  Preterm labor symptoms and general obstetric precautions including but not limited to vaginal bleeding, contractions, leaking of fluid and fetal movement were reviewed in detail with the patient. Please refer to After Visit Summary for other counseling recommendations.   No follow-ups on  file.  Future Appointments  Date Time Provider Department Center  05/30/2023  1:30 PM Lennart Pall, MD [facility]  06/04/2023  2:00 PM Sheets, Carmon Sails [facility] None  06/14/2023  1:30 PM Lennart Pall, MD [facility]  06/28/2023  1:30 PM Lennart Pall, MD [facility]  06/28/2023  3:30 PM [facility] US4 [facility]    Lennart Pall, MD

## 2023-05-31 ENCOUNTER — Encounter: Payer: Self-pay | Admitting: Obstetrics and Gynecology

## 2023-05-31 ENCOUNTER — Ambulatory Visit (INDEPENDENT_AMBULATORY_CARE_PROVIDER_SITE_OTHER): Payer: Medicaid Other | Admitting: Obstetrics and Gynecology

## 2023-05-31 VITALS — BP 134/79 | HR 102 | Wt 140.0 lb

## 2023-05-31 DIAGNOSIS — Z23 Encounter for immunization: Secondary | ICD-10-CM | POA: Diagnosis not present

## 2023-05-31 DIAGNOSIS — O0990 Supervision of high risk pregnancy, unspecified, unspecified trimester: Secondary | ICD-10-CM | POA: Diagnosis not present

## 2023-05-31 DIAGNOSIS — Z3A30 30 weeks gestation of pregnancy: Secondary | ICD-10-CM

## 2023-05-31 DIAGNOSIS — O09899 Supervision of other high risk pregnancies, unspecified trimester: Secondary | ICD-10-CM

## 2023-06-04 ENCOUNTER — Ambulatory Visit (INDEPENDENT_AMBULATORY_CARE_PROVIDER_SITE_OTHER): Payer: Medicaid Other | Admitting: Licensed Clinical Social Worker

## 2023-06-04 DIAGNOSIS — F411 Generalized anxiety disorder: Secondary | ICD-10-CM

## 2023-06-04 DIAGNOSIS — F39 Unspecified mood [affective] disorder: Secondary | ICD-10-CM | POA: Diagnosis not present

## 2023-06-05 ENCOUNTER — Other Ambulatory Visit: Payer: Self-pay | Admitting: Obstetrics and Gynecology

## 2023-06-05 DIAGNOSIS — O0990 Supervision of high risk pregnancy, unspecified, unspecified trimester: Secondary | ICD-10-CM

## 2023-06-05 DIAGNOSIS — O219 Vomiting of pregnancy, unspecified: Secondary | ICD-10-CM
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
## 2023-06-11 ENCOUNTER — Ambulatory Visit (INDEPENDENT_AMBULATORY_CARE_PROVIDER_SITE_OTHER): Payer: Medicaid Other | Admitting: Licensed Clinical Social Worker

## 2023-06-11 ENCOUNTER — Encounter (HOSPITAL_COMMUNITY): Payer: Self-pay

## 2023-06-11 DIAGNOSIS — F39 Unspecified mood [affective] disorder: Secondary | ICD-10-CM | POA: Diagnosis not present

## 2023-06-11 DIAGNOSIS — F411 Generalized anxiety disorder: Secondary | ICD-10-CM | POA: Diagnosis not present

## 2023-06-11 NOTE — Progress Notes (Signed)
   PRENATAL VISIT NOTE  Subjective:  Dawn Krause is a 23 y.o. [phone] at [redacted]w[redacted]d being seen today for ongoing prenatal care.  She is currently monitored for the following issues for this low-risk pregnancy and has Supervision of high risk pregnancy, antepartum; Short interval between pregnancies affecting pregnancy, antepartum; and History of preterm delivery, currently pregnant-hx PTL/PTD @33 .1 wks-recent infant demise on their problem list.  Patient reports  doing ok. Mood is so-so. No SI. Marland Kitchen  Contractions: Not present. Vag. Bleeding: None.  Movement: Present. Denies leaking of fluid.   The following portions of the patient's history were reviewed and updated as appropriate: allergies, current medications, past family history, past medical history, past social history, past surgical history and problem list.   Objective:   Vitals:   06/14/23 1347  BP: 109/73  Pulse: (!) 101  Weight: 143 lb (64.9 kg)    Fetal Status: Fetal Heart Rate (bpm): 149   Movement: Present     General:  Alert, oriented and cooperative. Patient is in no acute distress.  Skin: Skin is warm and dry. No rash noted.   Cardiovascular: Normal heart rate noted  Respiratory: Normal respiratory effort, no problems with respiration noted  Abdomen: Soft, gravid, appropriate for gestational age.  Pain/Pressure: Present      Assessment and Plan:  Pregnancy: [id number] at [redacted]w[redacted]d 1. Supervision of high risk pregnancy, antepartum (Primary) 2. [redacted] weeks gestation of pregnancy Growth [place] scheduled 2/27  3. Short interval between pregnancies affecting pregnancy, antepartum 4. History of preterm delivery, currently pregnant-hx PTL/PTD @33 .1 wks-recent infant demise  5. Depression affecting pregnancy F/w therapist Notes symptoms have been getting worse lately. Denies SI.  Increase lexapro to 15mg  daily and reassess  Preterm labor symptoms and general obstetric precautions including but not limited to vaginal bleeding,  contractions, leaking of fluid and fetal movement were reviewed in detail with the patient. Please refer to After Visit Summary for other counseling recommendations.   Return in about 2 weeks (around 06/28/2023) for return OB at 34 weeks.  Future Appointments  Date Time Provider Department Center  06/19/2023 11:00 AM Thresa Ross, MD [facility] None  06/27/2023  1:00 PM Sheets, Ivin Booty, LCSW [facility] None  06/28/2023  1:30 PM Lennart Pall, MD [facility]  06/28/2023  3:30 PM [facility]  07/04/2023  2:00 PM Sheets, Ivin Booty, LCSW [facility] None  07/09/2023  1:00 PM Sheets, Ivin Booty, LCSW [facility] None  07/12/2023  1:30 PM Milas Hock, MD [facility]  07/16/2023  2:00 PM Sheets, Ivin Booty, LCSW [facility] None  07/19/2023  1:30 PM Milas Hock, MD [facility]  07/26/2023  1:30 PM Milas Hock, MD [facility]    Lennart Pall, MD
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
## 2023-06-14 ENCOUNTER — Ambulatory Visit (INDEPENDENT_AMBULATORY_CARE_PROVIDER_SITE_OTHER): Payer: Medicaid Other | Admitting: Obstetrics and Gynecology

## 2023-06-14 VITALS — BP 109/73 | HR 101 | Wt 143.0 lb

## 2023-06-14 DIAGNOSIS — Z3A32 32 weeks gestation of pregnancy: Secondary | ICD-10-CM

## 2023-06-14 DIAGNOSIS — O99340 Other mental disorders complicating pregnancy, unspecified trimester: Secondary | ICD-10-CM | POA: Diagnosis not present

## 2023-06-14 DIAGNOSIS — F32A Depression, unspecified: Secondary | ICD-10-CM

## 2023-06-14 DIAGNOSIS — O09899 Supervision of other high risk pregnancies, unspecified trimester: Secondary | ICD-10-CM

## 2023-06-14 DIAGNOSIS — O0990 Supervision of high risk pregnancy, unspecified, unspecified trimester: Secondary | ICD-10-CM | POA: Diagnosis not present

## 2023-06-14 MED ORDER — ESCITALOPRAM OXALATE 10 MG PO TABS: 15.0000 mg | ORAL_TABLET | Freq: Every day | ORAL | 11 refills | Status: AC

## 2023-06-19 ENCOUNTER — Encounter (HOSPITAL_COMMUNITY): Payer: Self-pay | Admitting: Psychiatry

## 2023-06-19 ENCOUNTER — Ambulatory Visit (INDEPENDENT_AMBULATORY_CARE_PROVIDER_SITE_OTHER): Payer: Medicaid Other | Admitting: Psychiatry

## 2023-06-19 VITALS — BP 102/86 | HR 96 | Ht 62.0 in | Wt 143.0 lb

## 2023-06-19 DIAGNOSIS — F411 Generalized anxiety disorder: Secondary | ICD-10-CM

## 2023-06-19 DIAGNOSIS — F39 Unspecified mood [affective] disorder: Secondary | ICD-10-CM | POA: Diagnosis not present
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
## 2023-06-25 ENCOUNTER — Encounter: Payer: Self-pay | Admitting: Obstetrics and Gynecology

## 2023-06-27 ENCOUNTER — Other Ambulatory Visit (HOSPITAL_COMMUNITY)
Admission: RE | Admit: 2023-06-27 | Discharge: 2023-06-27 | Disposition: A | Discharge status: Home / Self Care | Payer: Medicaid Other | Source: Ambulatory Visit | Attending: Obstetrics and Gynecology | Admitting: Obstetrics and Gynecology

## 2023-06-27 ENCOUNTER — Telehealth: Payer: Self-pay | Admitting: *Deleted

## 2023-06-27 ENCOUNTER — Ambulatory Visit (INDEPENDENT_AMBULATORY_CARE_PROVIDER_SITE_OTHER): Payer: Medicaid Other | Admitting: Licensed Clinical Social Worker

## 2023-06-27 ENCOUNTER — Ambulatory Visit: Payer: Medicaid Other | Admitting: Obstetrics and Gynecology

## 2023-06-27 ENCOUNTER — Encounter: Payer: Medicaid Other | Admitting: Obstetrics and Gynecology

## 2023-06-27 VITALS — BP 128/89 | HR 108 | Wt 143.0 lb

## 2023-06-27 DIAGNOSIS — O99891 Other specified diseases and conditions complicating pregnancy: Secondary | ICD-10-CM

## 2023-06-27 DIAGNOSIS — M549 Dorsalgia, unspecified: Secondary | ICD-10-CM

## 2023-06-27 DIAGNOSIS — N898 Other specified noninflammatory disorders of vagina: Secondary | ICD-10-CM

## 2023-06-27 DIAGNOSIS — F411 Generalized anxiety disorder: Secondary | ICD-10-CM

## 2023-06-27 DIAGNOSIS — Z3A34 34 weeks gestation of pregnancy: Secondary | ICD-10-CM

## 2023-06-27 DIAGNOSIS — F39 Unspecified mood [affective] disorder: Secondary | ICD-10-CM

## 2023-06-27 DIAGNOSIS — R42 Dizziness and giddiness: Secondary | ICD-10-CM

## 2023-06-27 DIAGNOSIS — O0990 Supervision of high risk pregnancy, unspecified, unspecified trimester: Secondary | ICD-10-CM | POA: Diagnosis not present

## 2023-06-27 DIAGNOSIS — R030 Elevated blood-pressure reading, without diagnosis of hypertension: Secondary | ICD-10-CM | POA: Insufficient documentation

## 2023-06-27 DIAGNOSIS — F32A Depression, unspecified: Secondary | ICD-10-CM | POA: Insufficient documentation

## 2023-06-27 DIAGNOSIS — O99340 Other mental disorders complicating pregnancy, unspecified trimester: Secondary | ICD-10-CM

## 2023-06-27 DIAGNOSIS — O09899 Supervision of other high risk pregnancies, unspecified trimester: Secondary | ICD-10-CM

## 2023-06-27 NOTE — Telephone Encounter (Signed)
 Left patient a message to see if she is still coming to her appointment that was scheduled at 10:50 AM and to see if she is doing okay.

## 2023-06-27 NOTE — Progress Notes (Signed)
 Pt has had increase in pelvic pressure and vaginal discharge and itching Pt has noticed her vision will get blurry, hands will start tingling and legs feel weak

## 2023-06-27 NOTE — Progress Notes (Unsigned)
 THERAPIST PROGRESS NOTE  Session Time: 1:09 pm-1:50 pm  Type of Therapy: Individual Therapy  Purpose of session/Treatment Goals addressed: Hallie will manage mood and anxiety as evidenced by learning to express emotions appropriately based off past traumas, challenge depressive/anxious thoughts, and increase coping skills to deal with triggers for 5 out of 7 days for 60 days.   Interventions: Therapist utilized CBT, Solution Focused brief therapy,  and ACT  to address mood and anxiety. Therapist provided support and empathy to patient during session. Therapist administered the PHQ9 and GAD7.    Effectiveness: Patient was oriented x4 (person, place, situation, and time) . Patient was  Anxious and Depressed. Patient was Casually dressed. Patient completed a PHQ9 and GAD7.  Patient engaged in session. Patient responded well to interventions. Patient continues to meet criteria for Unspecified mood (affective) disorder (HCC)  Generalized anxiety disorder  Patient will continue in outpatient therapy due to being the least restrictive service to meet her needs. Patient made minimal progress on her goals at this time.      06/27/2023    1:10 PM 06/19/2023   11:23 AM 06/19/2023   11:05 AM 01/09/2023    1:56 PM 05/03/2022   11:40 AM  Depression screen PHQ 2/9  Decreased Interest 2  2 1  0  Down, Depressed, Hopeless 3  2 1  0  PHQ - 2 Score 5  4 2  0  Altered sleeping 3 3 3 1  0  Tired, decreased energy 3 2 3 1  0  Change in appetite 3 2 2  0 0  Feeling bad or failure about yourself  3 2 3  0 0  Trouble concentrating 2 2 3 1  0  Moving slowly or fidgety/restless 0 1 2 0 0  Suicidal thoughts 0 0 0 0 0  PHQ-9 Score 19  20 5  0  Difficult doing work/chores   Extremely dIfficult  Not difficult at all       06/27/2023    1:11 PM 06/19/2023   11:06 AM 01/09/2023    1:58 PM 05/03/2022   11:40 AM  GAD 7 : Generalized Anxiety Score  Nervous, Anxious, on Edge 3 3 0 0  Control/stop worrying 2 3 0 0  Worry too  much - different things 3 3 0 0  Trouble relaxing 3 3 0 0  Restless 3 3 0 0  Easily annoyed or irritable 2 3 2  0  Afraid - awful might happen 0 0 0 0  Total GAD 7 Score 16 18 2  0  Anxiety Difficulty Extremely difficult Extremely difficult          Suicidal/Homicidal:  No without intent/plan   Feelings of depression and/or anhedonia  Protective Factors: responsibility to others (children, family)  Plan: Patient will return in 2-4 weeks  Diagnosis: Axis I: Unspecified mood (affective) disorder (HCC)  Generalized anxiety disorder    Collaboration of Care: Other to be identified.   Patient/Guardian was advised Release of Information must be obtained prior to any record release in order to collaborate their care with an outside provider. Patient/Guardian was advised if they have not already done so to contact the registration department to sign all necessary forms in order for [place] to release information regarding their care.   Consent: Patient/Guardian gives verbal consent for treatment and assignment of benefits for services provided during this visit. Patient/Guardian expressed understanding and agreed to proceed.

## 2023-06-27 NOTE — Progress Notes (Signed)
 PRENATAL VISIT NOTE  Subjective:  Dawn Krause is a 23 y.o. [phone] at [redacted]w[redacted]d being seen today for ongoing prenatal care.  She is currently monitored for the following issues for this high-risk pregnancy and has Supervision of high risk pregnancy, antepartum; Short interval between pregnancies affecting pregnancy, antepartum; and History of preterm delivery, currently pregnant-hx PTL/PTD @33 .1 wks-recent infant demise on their problem list.  Patient reports the following: - Episodes of blurred vision, hands tingling, and weakness in her legs after standing for more than 10-39min; works at [employer] so is often standing at [employer] or walking around the store. Notes lightheadedness that is getting worse since Friday - Back pain - Nausea - has rx for phenergan and zofran; takes one at night nightly (not sure which) and then another one as needed but still feels nauseated. Not vomiting, keeping food down - Vaginal discharge & itching  Contractions: Irritability. Vag. Bleeding: None.  Movement: Present. Denies leaking of fluid.   The following portions of the patient's history were reviewed and updated as appropriate: allergies, current medications, past family history, past medical history, past social history, past surgical history and problem list.   Objective:   Vitals:   06/27/23 1407 06/27/23 1414  BP: (!) 142/88 128/89  Pulse: (!) 105 (!) 108  Weight: 143 lb (64.9 kg)     Fetal Status: Fetal Heart Rate (bpm): 137   Movement: Present     General:  Alert, oriented and cooperative. Patient is in no acute distress.  Skin: Skin is warm and dry. No rash noted.   Cardiovascular: Normal heart rate noted  Respiratory: Normal respiratory effort, no problems with respiration noted  Abdomen: Soft, gravid, appropriate for gestational age.  Pain/Pressure: Present      Assessment and Plan:  Pregnancy: G5P3103 at [redacted]w[redacted]d 1. Supervision of high risk pregnancy, antepartum  (Primary) 2. [redacted] weeks gestation of pregnancy Discussed GBS/GC/CT next visit  3. Vaginal itching/discharge - Cervicovaginal ancillary only( [facility])  4. Dizziness 5. Elevated blood pressure reading Mild range BP w/ normal recheck. BP cuff given for home BP monitoring. Will get baseline preeclampsia labs.  Discussed hydration, compression socks Letter for work accomodation sent (discussed frequent breaks, sitting, and need for weekly appt for remainder of pregnancy). Reviewed that it is up to employer/disability insurance if they will approve. - CBC - Comp Met (CMET) - Magnesium - Protein / creatinine ratio, urine  6. Back pain affecting pregnancy in third trimester - Ambulatory referral to Physical Therapy  7. Short interval between pregnancies affecting pregnancy, antepartum 8. History of preterm delivery, currently pregnant-hx PTL/PTD @33 .1 wks-recent infant demise  25. Depression affecting pregnancy F/w therapist/BH - saw today Mood improving since increasing to lexapro 15mg  daily  Please refer to After Visit Summary for other counseling recommendations.   Return in about 2 weeks (around 07/11/2023) for return OB at 36 weeks with GBS/GC/CT.  Future Appointments  Date Time Provider Department Center  06/28/2023  3:30 PM [facility] US4 [facility]  07/03/2023  1:15 PM Val Riles, PT [facility]  07/04/2023  2:00 PM Sheets, Ivin Booty, LCSW [facility] None  07/09/2023  1:00 PM Sheets, Ivin Booty, LCSW [facility] None  07/12/2023  1:30 PM Milas Hock, MD [facility]  07/16/2023  2:00 PM Sheets, Ivin Booty, LCSW [facility] None  07/17/2023  2:30 PM Thresa Ross, MD [facility] None  07/19/2023  1:30 PM Milas Hock, MD [facility]  07/26/2023  1:30 PM Milas Hock, MD [facility]  Lennart Pall, MD

## 2023-06-28 ENCOUNTER — Encounter: Payer: Medicaid Other | Admitting: Obstetrics and Gynecology

## 2023-06-28 ENCOUNTER — Ambulatory Visit: Payer: Medicaid Other

## 2023-06-28 LAB — COMPREHENSIVE METABOLIC PANEL
ALT: 6 IU/L (ref 0–32)
AST: 15 IU/L (ref 0–40)
Albumin: 3.9 g/dL — ABNORMAL LOW (ref 4.0–5.0)
Alkaline Phosphatase: 164 IU/L — ABNORMAL HIGH (ref 44–121)
BUN/Creatinine Ratio: 9 (ref 9–23)
BUN: 6 mg/dL (ref 6–20)
Bilirubin Total: 0.3 mg/dL (ref 0.0–1.2)
CO2: 21 mmol/L (ref 20–29)
Calcium: 8.9 mg/dL (ref 8.7–10.2)
Chloride: 103 mmol/L (ref 96–106)
Creatinine, Ser: 0.68 mg/dL (ref 0.57–1.00)
Globulin, Total: 3.2 g/dL (ref 1.5–4.5)
Glucose: 72 mg/dL (ref 70–99)
Potassium: 4.1 mmol/L (ref 3.5–5.2)
Sodium: 136 mmol/L (ref 134–144)
Total Protein: 7.1 g/dL (ref 6.0–8.5)
eGFR: 125 mL/min/{1.73_m2} (ref >59)

## 2023-06-28 LAB — MAGNESIUM: Magnesium: 1.6 mg/dL (ref 1.6–2.3)

## 2023-06-28 LAB — CERVICOVAGINAL ANCILLARY ONLY
Bacterial Vaginitis (gardnerella): NEGATIVE
Candida Glabrata: NEGATIVE
Candida Vaginitis: POSITIVE — AB
Comment: NEGATIVE
Comment: NEGATIVE
Comment: NEGATIVE

## 2023-06-28 LAB — CBC
Hematocrit: 33.2 % — ABNORMAL LOW (ref 34.0–46.6)
Hemoglobin: 11.1 g/dL (ref 11.1–15.9)
MCH: 27.2 pg (ref 26.6–33.0)
MCHC: 33.4 g/dL (ref 31.5–35.7)
MCV: 81 fL (ref 79–97)
Platelets: 174 10*3/uL (ref 150–450)
RBC: 4.08 x10E6/uL (ref 3.77–5.28)
RDW: 12.8 % (ref 11.7–15.4)
WBC: 12 10*3/uL — ABNORMAL HIGH (ref 3.4–10.8)

## 2023-06-28 LAB — PROTEIN / CREATININE RATIO, URINE
Creatinine, Urine: 137.2 mg/dL
Protein, Ur: 15 mg/dL
Protein/Creat Ratio: 109 mg/g{creat} (ref 0–200)

## 2023-06-28 MED ORDER — CLOTRIMAZOLE 1 % VA CREA: 1.0000 | TOPICAL_CREAM | Freq: Every day | VAGINAL | 0 refills | Status: AC

## 2023-06-28 MED ORDER — MAGNESIUM OXIDE (ELEMENTAL) 400 MG PO TABS: 1.0000 | ORAL_TABLET | Freq: Every day | ORAL | 1 refills | Status: AC

## 2023-06-28 NOTE — Telephone Encounter (Signed)
 Called patient, reviewed vulvovaginal candidiasis and slightly low mag (1.6) but otherwise lab results all within range for pregnancy. Mag supplement and VVC rx sent to preferred pharmacy.   Harvie Bridge, MD Obstetrician & Gynecologist, [facility] for [employer], [facility]

## 2023-06-28 NOTE — Addendum Note (Signed)
 Addended by: Harvie Bridge on: 06/28/2023 04:42 PM   Modules accepted: Orders

## 2023-07-03 ENCOUNTER — Ambulatory Visit
Payer: Medicaid Other | Attending: Obstetrics and Gynecology | Admitting: Rehabilitative and Restorative Service Providers"

## 2023-07-04 ENCOUNTER — Ambulatory Visit (HOSPITAL_COMMUNITY): Payer: Medicaid Other | Admitting: Licensed Clinical Social Worker

## 2023-07-09 ENCOUNTER — Ambulatory Visit (HOSPITAL_COMMUNITY): Payer: Medicaid Other | Admitting: Licensed Clinical Social Worker

## 2023-07-10 ENCOUNTER — Encounter: Payer: Self-pay | Admitting: Obstetrics and Gynecology

## 2023-07-12 ENCOUNTER — Other Ambulatory Visit (HOSPITAL_COMMUNITY)
Admission: RE | Admit: 2023-07-12 | Discharge: 2023-07-12 | Disposition: A | Discharge status: Home / Self Care | Source: Ambulatory Visit | Attending: Obstetrics and Gynecology | Admitting: Obstetrics and Gynecology

## 2023-07-12 ENCOUNTER — Encounter: Payer: Self-pay | Admitting: Obstetrics and Gynecology

## 2023-07-12 ENCOUNTER — Ambulatory Visit (INDEPENDENT_AMBULATORY_CARE_PROVIDER_SITE_OTHER): Payer: Medicaid Other | Admitting: Obstetrics and Gynecology

## 2023-07-12 ENCOUNTER — Other Ambulatory Visit: Payer: Self-pay | Admitting: *Deleted

## 2023-07-12 VITALS — BP 120/76 | HR 105 | Wt 144.0 lb

## 2023-07-12 DIAGNOSIS — R030 Elevated blood-pressure reading, without diagnosis of hypertension: Secondary | ICD-10-CM | POA: Diagnosis not present

## 2023-07-12 DIAGNOSIS — Z3483 Encounter for supervision of other normal pregnancy, third trimester: Secondary | ICD-10-CM | POA: Diagnosis not present

## 2023-07-12 DIAGNOSIS — Z3A36 36 weeks gestation of pregnancy: Secondary | ICD-10-CM | POA: Insufficient documentation

## 2023-07-12 DIAGNOSIS — F32A Depression, unspecified: Secondary | ICD-10-CM

## 2023-07-12 DIAGNOSIS — O09899 Supervision of other high risk pregnancies, unspecified trimester: Secondary | ICD-10-CM | POA: Diagnosis not present

## 2023-07-12 DIAGNOSIS — O0990 Supervision of high risk pregnancy, unspecified, unspecified trimester: Secondary | ICD-10-CM

## 2023-07-12 DIAGNOSIS — O99340 Other mental disorders complicating pregnancy, unspecified trimester: Secondary | ICD-10-CM

## 2023-07-12 NOTE — Progress Notes (Signed)
   PRENATAL VISIT NOTE  Subjective:  Dawn Krause is a 23 y.o. [phone] at 110w4d being seen today for ongoing prenatal care.  She is currently monitored for the following issues for this low-risk pregnancy and has Supervision of high risk pregnancy, antepartum; Short interval between pregnancies affecting pregnancy, antepartum; History of preterm delivery, currently pregnant-hx PTL/PTD @33 .1 wks-recent infant demise; Depression affecting pregnancy; and Elevated blood pressure reading on their problem list.  Patient reports occasional contractions.  Contractions: Irritability. Vag. Bleeding: None.  Movement: Present. Denies leaking of fluid.   The following portions of the patient's history were reviewed and updated as appropriate: allergies, current medications, past family history, past medical history, past social history, past surgical history and problem list.   Objective:   Vitals:   07/12/23 1428  BP: 120/76  Pulse: (!) 105  Weight: 144 lb (65.3 kg)    Fetal Status: Fetal Heart Rate (bpm): 136 Fundal Height: 36 cm Movement: Present     General:  Alert, oriented and cooperative. Patient is in no acute distress.  Skin: Skin is warm and dry. No rash noted.   Cardiovascular: Normal heart rate noted  Respiratory: Normal respiratory effort, no problems with respiration noted  Abdomen: Soft, gravid, appropriate for gestational age.  Pain/Pressure: Present     Pelvic: Cervical exam performed in the presence of a chaperone Dilation: 3 Effacement (%): 60 Station: -2  Extremities: Normal range of motion.  Edema: None  Mental Status: Normal mood and affect. Normal behavior. Normal judgment and thought content.   Assessment and Plan:  Pregnancy: [id number] at [redacted]w[redacted]d 1. Supervision of high risk pregnancy, antepartum (Primary) Cultures done today.   2. Short interval between pregnancies affecting pregnancy, antepartum 3. History of preterm delivery, currently pregnant-hx PTL/PTD @33 .1  wks-recent infant demise  4. Elevated blood pressure reading BP today is wnl Baseline labs last visit wnl.   5. Depression affecting pregnancy Mood is stable.   6. Pregnancy with 36 completed weeks gestation  Preterm labor symptoms and general obstetric precautions including but not limited to vaginal bleeding, contractions, leaking of fluid and fetal movement were reviewed in detail with the patient. Please refer to After Visit Summary for other counseling recommendations.   Return in about 1 week (around 07/19/2023) for OB VISIT, MD or APP.  Future Appointments  Date Time Provider Department Center  07/13/2023 11:00 AM Sheets, Ivin Booty, LCSW [facility] None  07/16/2023  2:00 PM Sheets, Ivin Booty, LCSW [facility] None  07/17/2023  2:30 PM Thresa Ross, MD [facility] None  07/18/2023  3:30 PM [facility] US6 [facility]  07/19/2023  1:30 PM Milas Hock, MD [facility]  07/26/2023  1:30 PM Milas Hock, MD [facility]    Milas Hock, MD

## 2023-07-13 ENCOUNTER — Ambulatory Visit (INDEPENDENT_AMBULATORY_CARE_PROVIDER_SITE_OTHER): Admitting: Licensed Clinical Social Worker

## 2023-07-13 DIAGNOSIS — F39 Unspecified mood [affective] disorder: Secondary | ICD-10-CM | POA: Diagnosis not present

## 2023-07-13 DIAGNOSIS — F411 Generalized anxiety disorder: Secondary | ICD-10-CM

## 2023-07-13 LAB — CERVICOVAGINAL ANCILLARY ONLY
Chlamydia: NEGATIVE
Comment: NEGATIVE
Comment: NORMAL
Neisseria Gonorrhea: NEGATIVE

## 2023-07-13 NOTE — Progress Notes (Signed)
 THERAPIST PROGRESS NOTE  Session Time: 11:01 am-11:40 am  Type of Therapy: Individual Therapy  Purpose of session/Treatment Goals addressed: Dawn Krause will manage mood and anxiety as evidenced by learning to express emotions appropriately based off past traumas, challenge depressive/anxious thoughts, and increase coping skills to deal with triggers for 5 out of 7 days for 60 days.   Interventions: Therapist utilized CBT, Solution Focused brief therapy,  and ACT  to address mood and anxiety. Therapist provided support and empathy to patient during session. Therapist administered the PHQ9 and GAD7.  Therapist worked patient on expressing emotions appropriately and challenging anxious thoughts.    Effectiveness: Patient was oriented x4 (person, place, situation, and time) . Patient was  Anxious and Depressed. Patient was Casually dressed. Patient completed a PHQ9 and GAD7.  Patient has been tired. She is close to giving birth. Patient noted she is dilated 3 cm and has lost part of her mucus plug. She is experiencing contractions. Patient feels like she will probably  go into labor before the weekend is over. Patient noted that she has also been stressed over the girl living in their home. Patient noted that the roommate is not cleaning up, not paying anything, taking disciplining her daughter to the extreme (yelling at 3 am), and expecting patient to watch her baby after she gives birth. Patient noted this person thought a daycare would watch her baby a week after giving birth and she was planning on only taking a week off of work. Patient and this person have same due date in April. Patient worries that she will "go off" on this person if she comes in her room which she has been asked not to do or yells at her daughter and wakes her baby up from napping. Patient is also worried that when she goes to the hospital that the nurses may remember the last time she was there and ask about her child that passed. She is  already nervous about going to the hospital due to the last experience in the hospital.    Patient engaged in session. Patient responded well to interventions. Patient continues to meet criteria for Unspecified mood (affective) disorder (HCC)  Generalized anxiety disorder  Patient will continue in outpatient therapy due to being the least restrictive service to meet her needs. Patient made minimal progress on her goals at this time.      07/13/2023   11:03 AM 06/27/2023    1:10 PM 06/19/2023   11:23 AM 06/19/2023   11:05 AM 01/09/2023    1:56 PM  Depression screen PHQ 2/9  Decreased Interest 3 2  2 1   Down, Depressed, Hopeless 2 3  2 1   PHQ - 2 Score 5 5  4 2   Altered sleeping 3 3 3 3 1   Tired, decreased energy 3 3 2 3 1   Change in appetite 3 3 2 2  0  Feeling bad or failure about yourself  3 3 2 3  0  Trouble concentrating 3 2 2 3 1   Moving slowly or fidgety/restless 2 0 1 2 0  Suicidal thoughts 0 0 0 0 0  PHQ-9 Score 22 19  20 5   Difficult doing work/chores    Extremely dIfficult        07/13/2023   11:04 AM 06/27/2023    1:11 PM 06/19/2023   11:06 AM 01/09/2023    1:58 PM  GAD 7 : Generalized Anxiety Score  Nervous, Anxious, on Edge 2 3 3  0  Control/stop worrying 2  2 3 0  Worry too much - different things 3 3 3  0  Trouble relaxing 3 3 3  0  Restless 3 3 3  0  Easily annoyed or irritable 2 2 3 2   Afraid - awful might happen 0 0 0 0  Total GAD 7 Score 15 16 18 2   Anxiety Difficulty Extremely difficult Extremely difficult Extremely difficult         Suicidal/Homicidal:  No without intent/plan   Feelings of depression and/or anhedonia  Protective Factors: responsibility to others (children, family)  Plan: Patient will return in 2-4 weeks  Diagnosis: Axis I: Unspecified mood (affective) disorder (HCC)  Generalized anxiety disorder    Collaboration of Care: Other to be identified.   Patient/Guardian was advised Release of Information must be obtained prior to any record  release in order to collaborate their care with an outside provider. Patient/Guardian was advised if they have not already done so to contact the registration department to sign all necessary forms in order for [place] to release information regarding their care.   Consent: Patient/Guardian gives verbal consent for treatment and assignment of benefits for services provided during this visit. Patient/Guardian expressed understanding and agreed to proceed.

## 2023-07-16 ENCOUNTER — Inpatient Hospital Stay (HOSPITAL_COMMUNITY)
Admission: AD | Admit: 2023-07-16 | Discharge: 2023-07-16 | Disposition: A | Discharge status: Home / Self Care | Attending: Obstetrics & Gynecology | Admitting: Obstetrics & Gynecology

## 2023-07-16 ENCOUNTER — Encounter (HOSPITAL_COMMUNITY): Payer: Self-pay | Admitting: Obstetrics & Gynecology

## 2023-07-16 ENCOUNTER — Ambulatory Visit (HOSPITAL_COMMUNITY): Payer: Medicaid Other | Admitting: Licensed Clinical Social Worker

## 2023-07-16 DIAGNOSIS — O471 False labor at or after 37 completed weeks of gestation: Secondary | ICD-10-CM | POA: Insufficient documentation

## 2023-07-16 DIAGNOSIS — Z3A37 37 weeks gestation of pregnancy: Secondary | ICD-10-CM | POA: Diagnosis not present

## 2023-07-16 DIAGNOSIS — O0990 Supervision of high risk pregnancy, unspecified, unspecified trimester: Secondary | ICD-10-CM

## 2023-07-16 DIAGNOSIS — O479 False labor, unspecified: Secondary | ICD-10-CM

## 2023-07-16 LAB — CULTURE, BETA STREP (GROUP B ONLY): Strep Gp B Culture: NEGATIVE

## 2023-07-16 NOTE — Progress Notes (Deleted)
   PRENATAL VISIT NOTE  Subjective:  Dawn Krause is a 23 y.o. [phone] at 37w***d being seen today for ongoing prenatal care.  She is currently monitored for the following issues for this low-risk pregnancy and has Supervision of high risk pregnancy, antepartum; Short interval between pregnancies affecting pregnancy, antepartum; History of preterm delivery, currently pregnant-hx PTL/PTD @33 .1 wks-recent infant demise; Depression affecting pregnancy; and Elevated blood pressure reading on their problem list.  Patient reports {sx:14538}.   .  .   . Denies leaking of fluid.   The following portions of the patient's history were reviewed and updated as appropriate: allergies, current medications, past family history, past medical history, past social history, past surgical history and problem list.   Objective:  There were no vitals filed for this visit.  Fetal Status:           General:  Alert, oriented and cooperative. Patient is in no acute distress.  Skin: Skin is warm and dry. No rash noted.   Cardiovascular: Normal heart rate noted  Respiratory: Normal respiratory effort, no problems with respiration noted  Abdomen: Soft, gravid, appropriate for gestational age.        Pelvic: {Blank single:19197::"Cervical exam performed in the presence of a chaperone","Cervical exam deferred"}        Extremities: Normal range of motion.     Mental Status: Normal mood and affect. Normal behavior. Normal judgment and thought content.   Assessment and Plan:  Pregnancy: [id number] at 37w***d 1. Depression affecting pregnancy (Primary) Stable. Following with [facility]BH.   2. Elevated blood pressure reading BP today is ***  3. Short interval between pregnancies affecting pregnancy, antepartum  4. Supervision of high risk pregnancy, antepartum GBS neg Discussed process of eIOL.   5. Pregnancy with 37 weeks completed gestation ***  Term labor symptoms and general obstetric precautions including but not  limited to vaginal bleeding, contractions, leaking of fluid and fetal movement were reviewed in detail with the patient. Please refer to After Visit Summary for other counseling recommendations.   No follow-ups on file.  Future Appointments  Date Time Provider Department Center  07/17/2023  2:30 PM Thresa Ross, MD [facility] None  07/18/2023  3:30 PM [facility] US6 [facility]  07/19/2023  1:30 PM Milas Hock, MD [facility]  07/26/2023 10:00 AM Sheets, Ivin Booty, LCSW [facility] None  07/26/2023  1:30 PM Milas Hock, MD [facility]    Milas Hock, MD

## 2023-07-16 NOTE — MAU Provider Note (Signed)
 S: Ms. Dawn Krause is a 23 y.o. [phone] at [redacted]w[redacted]d  who presents to MAU today for labor evaluation.     Cervical exam by RN:  Dilation: 3 Effacement (%): 60 Station: -3 Presentation: Undeterminable Exam by:: [employer], RN  Fetal Monitoring: Baseline: 125 Variability: moderate Accelerations: accels Decelerations: absent Contractions: irregular  MDM Discussed patient with RN. NST reviewed.   A: SIUP at [redacted]w[redacted]d  False labor  P: Discharge home Labor precautions and kick counts included in AVS Patient to follow-up with primary OB as scheduled  Patient may return to MAU as needed or when in labor   Joanne Gavel, MD 07/16/2023 10:40 PM

## 2023-07-16 NOTE — MAU Note (Addendum)
 Pt says she had pinkish D/C  at 630pm-  Feeling UC's - 7/10 [facility]- [place] office. VE- on Thursday - 3 cm Denies HSV GBS- neg  Has had vag pressure - started Sat- worse today . Last BM- Sunday

## 2023-07-17 ENCOUNTER — Ambulatory Visit (HOSPITAL_COMMUNITY): Payer: Medicaid Other | Admitting: Psychiatry

## 2023-07-18 ENCOUNTER — Encounter (HOSPITAL_COMMUNITY): Payer: Self-pay | Admitting: Obstetrics and Gynecology

## 2023-07-18 ENCOUNTER — Inpatient Hospital Stay (HOSPITAL_COMMUNITY): Admitting: Anesthesiology

## 2023-07-18 ENCOUNTER — Inpatient Hospital Stay (HOSPITAL_COMMUNITY)
Admission: AD | Admit: 2023-07-18 | Discharge: 2023-07-21 | DRG: 807 | Disposition: A | Discharge status: Home / Self Care | Payer: Self-pay | Attending: Obstetrics & Gynecology | Admitting: Obstetrics & Gynecology

## 2023-07-18 ENCOUNTER — Ambulatory Visit: Payer: Medicaid Other

## 2023-07-18 DIAGNOSIS — O99344 Other mental disorders complicating childbirth: Secondary | ICD-10-CM | POA: Diagnosis present

## 2023-07-18 DIAGNOSIS — Z8249 Family history of ischemic heart disease and other diseases of the circulatory system: Secondary | ICD-10-CM

## 2023-07-18 DIAGNOSIS — O26893 Other specified pregnancy related conditions, third trimester: Secondary | ICD-10-CM | POA: Diagnosis present

## 2023-07-18 DIAGNOSIS — O09899 Supervision of other high risk pregnancies, unspecified trimester: Secondary | ICD-10-CM

## 2023-07-18 DIAGNOSIS — Z3A37 37 weeks gestation of pregnancy: Secondary | ICD-10-CM | POA: Diagnosis not present

## 2023-07-18 DIAGNOSIS — Z3490 Encounter for supervision of normal pregnancy, unspecified, unspecified trimester: Principal | ICD-10-CM

## 2023-07-18 DIAGNOSIS — Z833 Family history of diabetes mellitus: Secondary | ICD-10-CM

## 2023-07-18 DIAGNOSIS — O4202 Full-term premature rupture of membranes, onset of labor within 24 hours of rupture: Secondary | ICD-10-CM | POA: Diagnosis not present

## 2023-07-18 DIAGNOSIS — O9902 Anemia complicating childbirth: Principal | ICD-10-CM | POA: Diagnosis present

## 2023-07-18 DIAGNOSIS — O326XX0 Maternal care for compound presentation, not applicable or unspecified: Secondary | ICD-10-CM | POA: Diagnosis not present

## 2023-07-18 DIAGNOSIS — Z79899 Other long term (current) drug therapy: Secondary | ICD-10-CM

## 2023-07-18 DIAGNOSIS — F32A Depression, unspecified: Secondary | ICD-10-CM | POA: Diagnosis present

## 2023-07-18 DIAGNOSIS — O09293 Supervision of pregnancy with other poor reproductive or obstetric history, third trimester: Secondary | ICD-10-CM | POA: Diagnosis not present

## 2023-07-18 DIAGNOSIS — O99340 Other mental disorders complicating pregnancy, unspecified trimester: Secondary | ICD-10-CM | POA: Diagnosis present

## 2023-07-18 HISTORY — DX: Depression, unspecified: F32.A

## 2023-07-18 LAB — CBC
HCT: 33 % — ABNORMAL LOW (ref 36.0–46.0)
Hemoglobin: 10.4 g/dL — ABNORMAL LOW (ref 12.0–15.0)
MCH: 25.4 pg — ABNORMAL LOW (ref 26.0–34.0)
MCHC: 31.5 g/dL (ref 30.0–36.0)
MCV: 80.7 fL (ref 80.0–100.0)
Platelets: 161 10*3/uL (ref 150–400)
RBC: 4.09 MIL/uL (ref 3.87–5.11)
RDW: 13.5 % (ref 11.5–15.5)
WBC: 14.1 10*3/uL — ABNORMAL HIGH (ref 4.0–10.5)
nRBC: 0 % (ref 0.0–0.2)

## 2023-07-18 LAB — TYPE AND SCREEN
ABO/RH(D): A POS
Antibody Screen: NEGATIVE

## 2023-07-18 IMAGING — US US MFM OB FOLLOW-UP
1 series · 14 of 28 positions shown · non-contrast
Comparison: none

[Series 1: us mfm ob follow-up · 40 acquisitions, 14 frames shown]
[im 2/40]
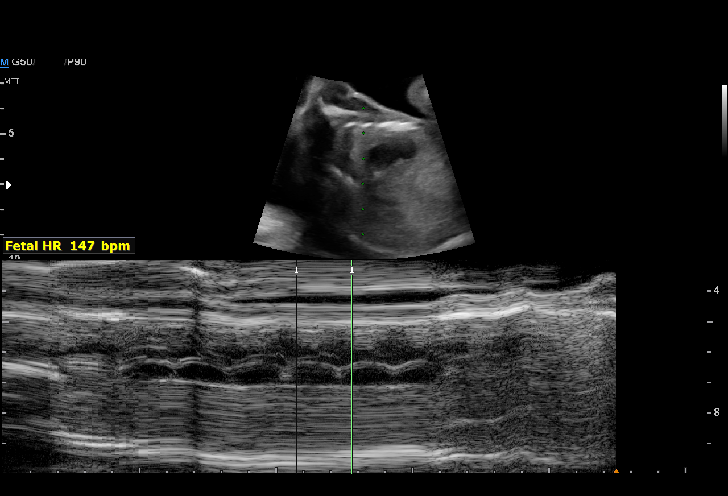
[im 5/40]
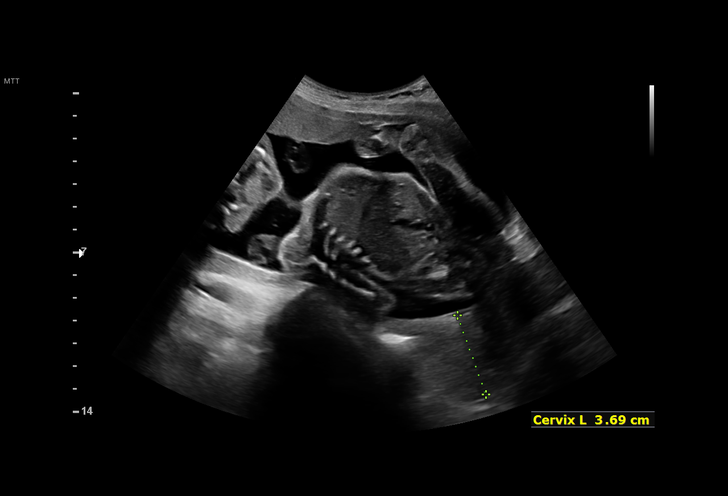
[im 8/40]
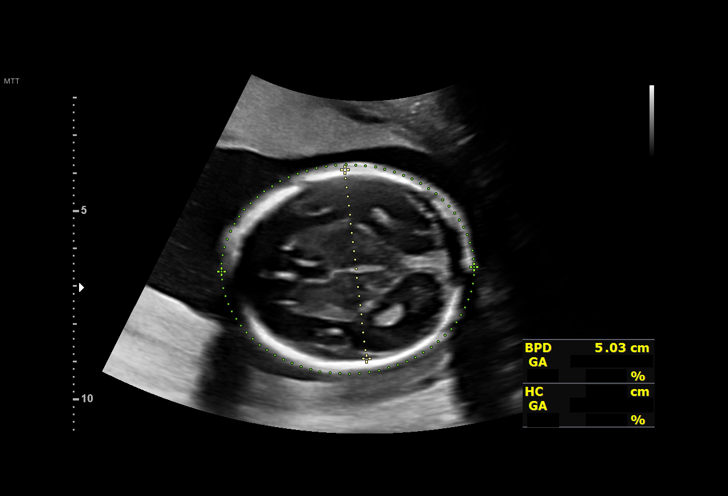
[im 11/40]
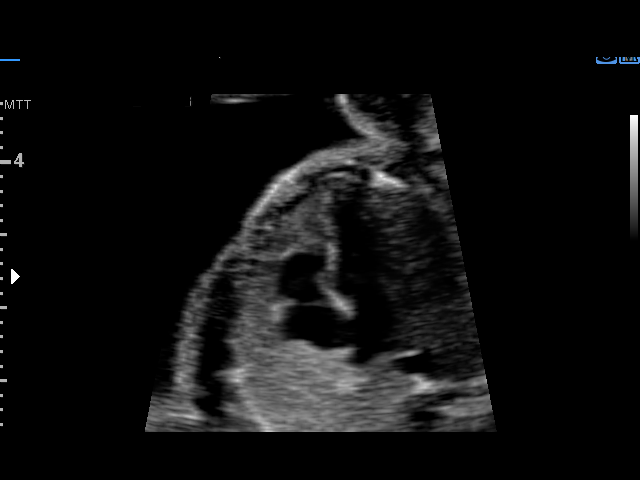
[im 14/40]
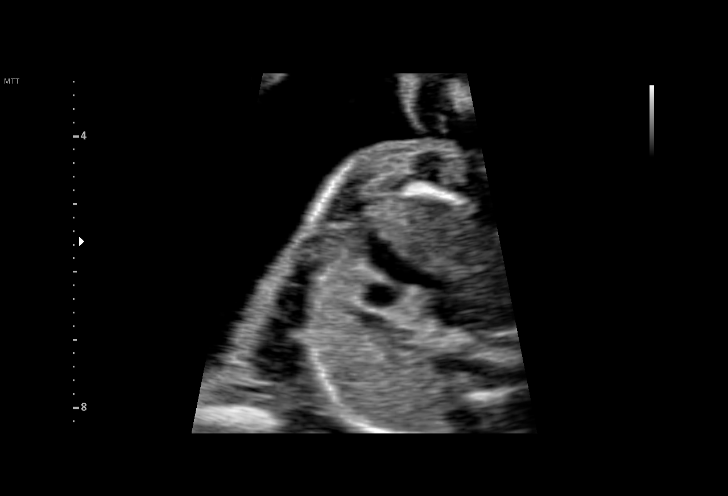
[im 16/40]
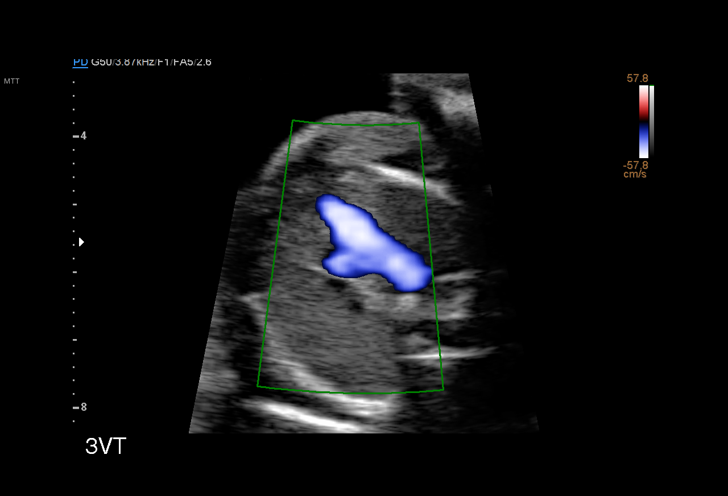
[im 19/40]
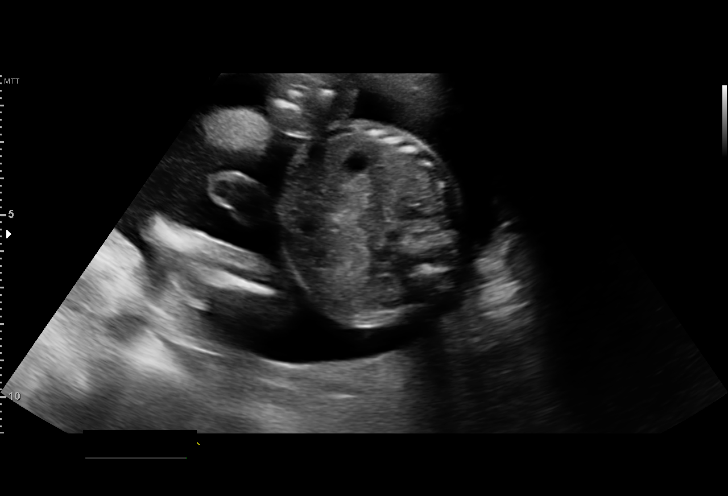
[im 22/40]
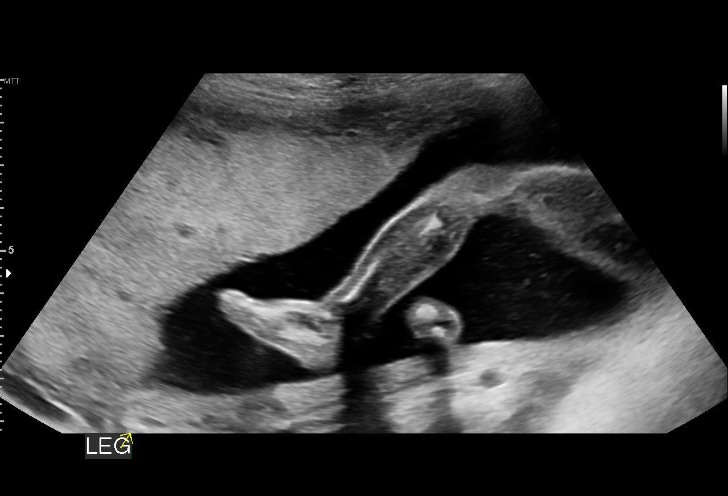
[im 25/40]
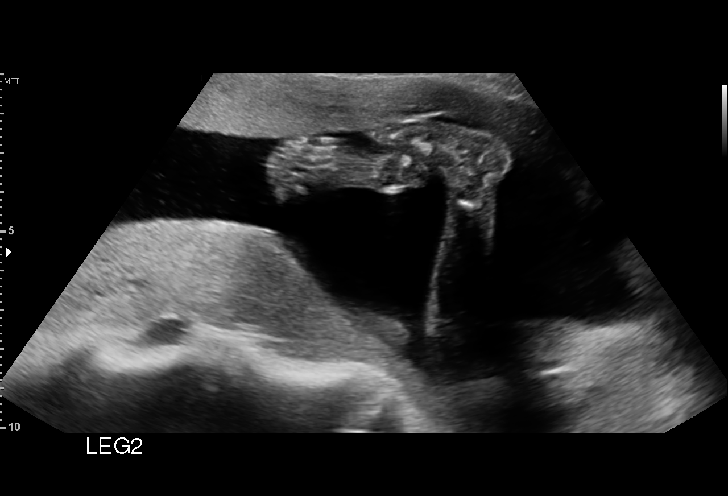
[im 28/40]
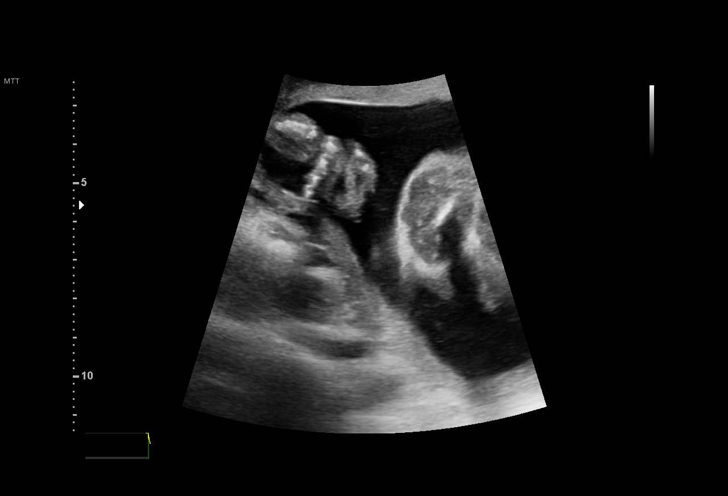
[im 31/40]
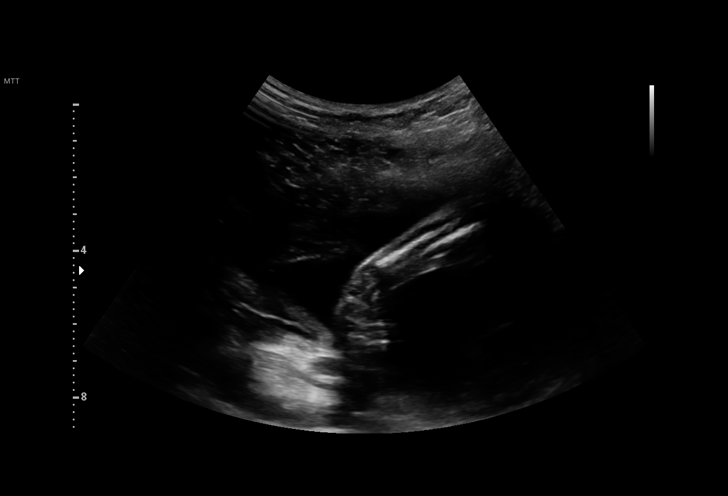
[im 34/40]
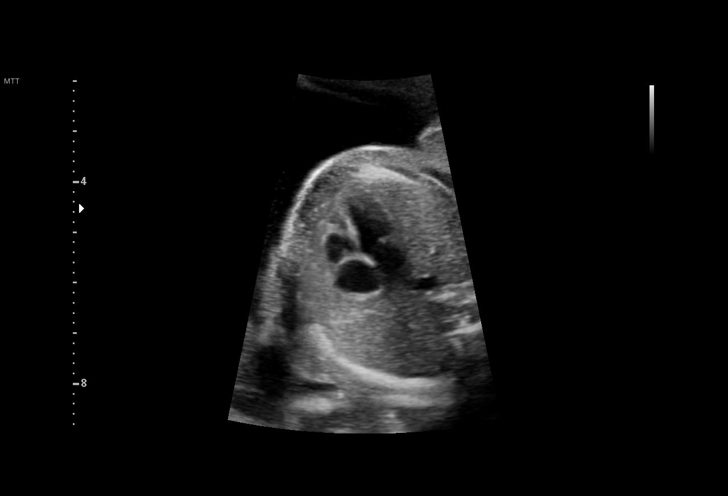
[im 37/40]
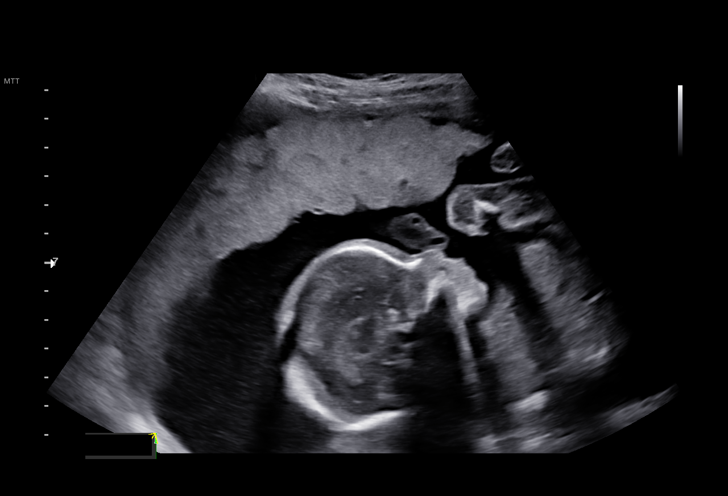
[im 40/40]
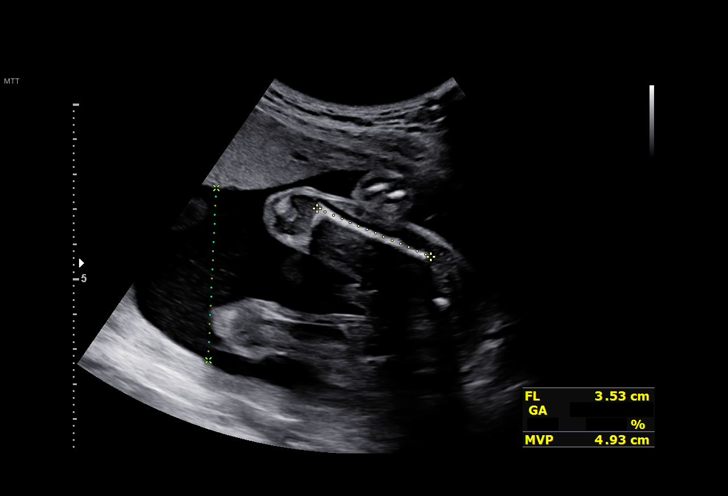

[14 of 28 positions shown; findings below may reference images not displayed]

Indications

 22 weeks gestation of pregnancy
 Anemia during pregnancy in second
 trimester (7.6)
 Short interval between pregnancies
 complicating pregnancy, antepartum
Fetal Evaluation

 Num Of Fetuses:         1
 Fetal Heart Rate(bpm):  147
 Cardiac Activity:       Observed
 Presentation:           Breech
 Placenta:               Anterior
 P. Cord Insertion:      Previously Visualized

 Amniotic Fluid
 AFI FV:      Within normal limits

                             Largest Pocket(cm)

Biometry

 BPD:      50.7  mm     G. Age:  21w 3d         23  %    CI:        70.69   %    70 - 86
                                                         FL/HC:      18.3   %    18.4 -
 HC:      192.2  mm     G. Age:  21w 3d         18  %    HC/AC:      1.07        1.06 -
 AC:      179.8  mm     G. Age:  22w 6d         70  %    FL/BPD:     69.2   %    71 - 87
 FL:       35.1  mm     G. Age:  21w 1d         14  %    FL/AC:      19.5   %    20 - 24

 LV:        4.4  mm

 Est. FW:     465  gm           1 lb     42  %
OB History

 Blood Type:   A+
 Gravidity:    3         Term:   2
Gestational Age

 LMP:           22w 5d        Date:  07/15/20                 EDD:   04/21/21
 U/S Today:     21w 5d                                        EDD:   04/28/21
 Best:          22w 0d     Det. By:  U/S C R L  (10/15/20)    EDD:   04/26/21
Anatomy

 Cranium:               Appears normal         Aortic Arch:            Previously seen
 Cavum:                 Previously seen        Ductal Arch:            Appears normal
 Ventricles:            Appears normal         Diaphragm:              Previously seen
 Choroid Plexus:        Previously seen        Stomach:                Appears normal, left
                                                                       sided
 Cerebellum:            Previously seen        Abdomen:                Previously seen
 Posterior Fossa:       Previously seen        Abdominal Wall:         Previously seen
 Nuchal Fold:           Previously seen        Cord Vessels:           Previously seen
 Face:                  Orbits and profile     Kidneys:                Appear normal
                        previously seen
 Lips:                  Previously seen        Bladder:                Appears normal
 Thoracic:              Previously seen        Spine:                  Previously seen
 Heart:                 Appears normal         Upper Extremities:      Appears normal
                        (4CH, axis, and
                        situs)
 RVOT:                  Previously seen        Lower Extremities:      Appears normal
 LVOT:                  Previously seen

 Other:  BCV previously visualized. 3VV and 3VTV visualized. Fetus appears
         to be a male.
Cervix Uterus Adnexa

 Cervix
 Length:           3.69  cm.
 Normal appearance by transabdominal scan.

 Right Ovary
 Visualized.

 Left Ovary
 Visualized.
Impression

 Patient returned for completion of fetal anatomy .Amniotic
 fluid is normal and good fetal activity is seen .Fetal biometry
 is consistent with her previously-established dates .Fetal
 anatomical survey was completed and appears normal.
Recommendations

 Follow-up scans as clinically indicated.
                 Assucena, Sacatindi

## 2023-07-18 MED ORDER — DIPHENHYDRAMINE HCL 50 MG/ML IJ SOLN: 12.5000 mg | INTRAMUSCULAR | Status: DC | PRN

## 2023-07-18 MED ORDER — ESCITALOPRAM OXALATE 10 MG PO TABS
15.0000 mg | ORAL_TABLET | Freq: Every day | ORAL | Status: DC
  Administered 2023-07-19 – 2023-07-21 (×3): 15 mg via ORAL
  Filled 2023-07-18 (×2): qty 2
  Filled 2023-07-18: qty 1.5

## 2023-07-18 MED ORDER — LACTATED RINGERS IV SOLN: 500.0000 mL | INTRAVENOUS | Status: DC | PRN

## 2023-07-18 MED ORDER — LIDOCAINE HCL (PF) 1 % IJ SOLN
INTRAMUSCULAR | Status: DC | PRN
  Administered 2023-07-18: 5 mL via EPIDURAL

## 2023-07-18 MED ORDER — FENTANYL-BUPIVACAINE-NACL 0.5-0.125-0.9 MG/250ML-% EP SOLN
12.0000 mL/h | EPIDURAL | Status: DC | PRN
  Administered 2023-07-18: 12 mL/h via EPIDURAL
  Filled 2023-07-18: qty 250

## 2023-07-18 MED ORDER — FENTANYL CITRATE (PF) 100 MCG/2ML IJ SOLN
50.0000 ug | INTRAMUSCULAR | Status: DC | PRN
  Administered 2023-07-18: 100 ug via INTRAVENOUS
  Filled 2023-07-18: qty 2

## 2023-07-18 MED ORDER — EPHEDRINE 5 MG/ML INJ: 10.0000 mg | INTRAVENOUS | Status: DC | PRN

## 2023-07-18 MED ORDER — ACETAMINOPHEN 325 MG PO TABS
650.0000 mg | ORAL_TABLET | ORAL | Status: DC | PRN
  Administered 2023-07-19: 650 mg via ORAL
  Filled 2023-07-18: qty 2

## 2023-07-18 MED ORDER — LIDOCAINE-EPINEPHRINE (PF) 1.5 %-1:200000 IJ SOLN
INTRAMUSCULAR | Status: DC | PRN
  Administered 2023-07-18: 5 mL via EPIDURAL

## 2023-07-18 MED ORDER — OXYTOCIN BOLUS FROM INFUSION
333.0000 mL | Freq: Once | INTRAVENOUS | Status: AC
  Administered 2023-07-19: 333 mL via INTRAVENOUS

## 2023-07-18 MED ORDER — LACTATED RINGERS IV SOLN: INTRAVENOUS | Status: DC

## 2023-07-18 MED ORDER — OXYCODONE-ACETAMINOPHEN 5-325 MG PO TABS: 2.0000 | ORAL_TABLET | ORAL | Status: DC | PRN

## 2023-07-18 MED ORDER — LIDOCAINE HCL (PF) 1 % IJ SOLN: 30.0000 mL | INTRAMUSCULAR | Status: DC | PRN

## 2023-07-18 MED ORDER — SOD CITRATE-CITRIC ACID 500-334 MG/5ML PO SOLN: 30.0000 mL | ORAL | Status: DC | PRN

## 2023-07-18 MED ORDER — OXYCODONE-ACETAMINOPHEN 5-325 MG PO TABS: 1.0000 | ORAL_TABLET | ORAL | Status: DC | PRN

## 2023-07-18 MED ORDER — FLEET ENEMA RE ENEM: 1.0000 | ENEMA | RECTAL | Status: DC | PRN

## 2023-07-18 MED ORDER — OXYTOCIN-SODIUM CHLORIDE 30-0.9 UT/500ML-% IV SOLN
2.5000 [IU]/h | INTRAVENOUS | Status: DC
  Administered 2023-07-19: 2.5 [IU]/h via INTRAVENOUS
  Filled 2023-07-18: qty 500

## 2023-07-18 MED ORDER — LACTATED RINGERS IV SOLN
500.0000 mL | Freq: Once | INTRAVENOUS | Status: DC
Start: 2023-07-18 — End: 2023-07-19

## 2023-07-18 MED ORDER — PHENYLEPHRINE 80 MCG/ML (10ML) SYRINGE FOR IV PUSH (FOR BLOOD PRESSURE SUPPORT): 80.0000 ug | PREFILLED_SYRINGE | INTRAVENOUS | Status: DC | PRN

## 2023-07-18 MED ORDER — ONDANSETRON HCL 4 MG/2ML IJ SOLN
4.0000 mg | Freq: Four times a day (QID) | INTRAMUSCULAR | Status: DC | PRN
  Administered 2023-07-18: 4 mg via INTRAVENOUS
  Filled 2023-07-18: qty 2

## 2023-07-18 NOTE — Anesthesia Preprocedure Evaluation (Signed)
 Anesthesia Evaluation  Patient identified by MRN, date of birth, ID band Patient awake    Reviewed: Allergy & Precautions, H&P , NPO status , Patient's Chart, lab work & pertinent test results  History of Anesthesia Complications Negative for: history of anesthetic complications  Airway Mallampati: II  TM Distance: >3 FB Neck ROM: full    Dental no notable dental hx. (+) Teeth Intact   Pulmonary neg pulmonary ROS   Pulmonary exam normal breath sounds clear to auscultation       Cardiovascular negative cardio ROS Normal cardiovascular exam Rhythm:regular Rate:Normal     Neuro/Psych  PSYCHIATRIC DISORDERS  Depression    negative neurological ROS     GI/Hepatic negative GI ROS, Neg liver ROS,,,  Endo/Other  negative endocrine ROS    Renal/GU negative Renal ROS  negative genitourinary   Musculoskeletal   Abdominal   Peds  Hematology  (+) Blood dyscrasia, anemia   Anesthesia Other Findings   Reproductive/Obstetrics (+) Pregnancy                             Anesthesia Physical Anesthesia Plan  ASA: 2  Anesthesia Plan: Epidural   Post-op Pain Management:    Induction:   PONV Risk Score and Plan:   Airway Management Planned:   Additional Equipment:   Intra-op Plan:   Post-operative Plan:   Informed Consent: I have reviewed the patients History and Physical, chart, labs and discussed the procedure including the risks, benefits and alternatives for the proposed anesthesia with the patient or authorized representative who has indicated his/her understanding and acceptance.       Plan Discussed with:   Anesthesia Plan Comments:        Anesthesia Quick Evaluation

## 2023-07-18 NOTE — Progress Notes (Signed)
 Patient ID: Dawn Krause, female   DOB: 2000/09/26, 23 y.o.   MRN: [id number] Dawn Krause is a 23 y.o. [id number] at [redacted]w[redacted]d.  Subjective: Pt laying in bed talking on phone. Agreeable with SVE.  Objective: BP 106/63   Pulse 97   Temp 98.3 F (36.8 C) (Oral)   Resp 16   Ht 5\' 2"  (1.575 m)   Wt 66.7 kg   LMP 10/11/2022 (Approximate)   SpO2 99%   BMI 26.89 kg/m    FHT:  FHR: 130 bpm, variability: moderate,  accelerations:  present,  decelerations:  none UC:   Q 6-8 minutes, irregular Dilation: 7 Effacement (%): 90 Station: -2 Presentation: Vertex Exam by:: Pamala Hurry, CNM student  Labs: Results for orders placed or performed during the hospital encounter of 07/18/23 (from the past 24 hours)  CBC     Status: Abnormal   Collection Time: 07/18/23 12:44 PM  Result Value Ref Range   WBC 14.1 (H) 4.0 - 10.5 K/uL   RBC 4.09 3.87 - 5.11 MIL/uL   Hemoglobin 10.4 (L) 12.0 - 15.0 g/dL   HCT 03.4 (L) 74.2 - 59.5 %   MCV 80.7 80.0 - 100.0 fL   MCH 25.4 (L) 26.0 - 34.0 pg   MCHC 31.5 30.0 - 36.0 g/dL   RDW 63.8 75.6 - 43.3 %   Platelets 161 150 - 400 K/uL   nRBC 0.0 0.0 - 0.2 %  Type and screen [facility]     Status: None   Collection Time: 07/18/23 12:44 PM  Result Value Ref Range   ABO/RH(D) A POS    Antibody Screen NEG    Sample Expiration      07/21/2023,2359 Performed at [facility], [address] [place], [place] [address]     Assessment / Plan: [redacted]w[redacted]d week IUP ROM x Hours: 6 Minutes: 41 GBS neg Labor: progressing well, bag of water noted on exam, AROM with clear fluid noted. Recheck cervix in 2 hours or prn. Fetal Wellbeing:  Category 1 Pain Control:  Epidural Anticipated MOD:  SVD  Ky Barban 07/18/2023 10:39 PM

## 2023-07-18 NOTE — Anesthesia Procedure Notes (Signed)
 Epidural Patient location during procedure: OB Start time: 07/18/2023 7:49 PM End time: 07/18/2023 7:59 PM  Staffing Anesthesiologist: Leonides Grills, MD Performed: anesthesiologist   Preanesthetic Checklist Completed: patient identified, IV checked, site marked, risks and benefits discussed, monitors and equipment checked, pre-op evaluation and timeout performed  Epidural Patient position: sitting Prep: DuraPrep Patient monitoring: heart rate, cardiac monitor, continuous pulse ox and blood pressure Approach: midline Location: L4-L5 Injection technique: LOR air  Needle:  Needle type: Tuohy  Needle gauge: 17 G Needle length: 9 cm Needle insertion depth: 6 cm Catheter type: closed end flexible Catheter size: 19 Gauge Catheter at skin depth: 11 cm Test dose: negative and 1.5% lidocaine with Epi 1:200 K  Assessment Events: blood not aspirated, no cerebrospinal fluid, injection not painful, no injection resistance and negative IV test  Additional Notes Informed consent obtained prior to proceeding including risk of failure, 1% risk of PDPH, risk of minor discomfort and bruising. Discussed alternatives to epidural analgesia and patient desires to proceed.  Timeout performed pre-procedure verifying patient name, procedure, and platelet count.  Loss of resistance encountered on the third attempt. Patient tolerated procedure well. Reason for block:procedure for pain

## 2023-07-18 NOTE — Progress Notes (Signed)
 Patient ID: Dawn Krause, female   DOB: 26-Aug-2000, 23 y.o.   MRN: [id number] Dawn Krause is a 23 y.o. [id number] at [redacted]w[redacted]d.  Subjective: Pt sitting up in bed comfortable with epidural.  Objective: BP 110/67   Pulse 91   Temp 98 F (36.7 C) (Oral)   Resp 16   Ht 5\' 2"  (1.575 m)   Wt 66.7 kg   LMP 10/11/2022 (Approximate)   SpO2 99%   BMI 26.89 kg/m    FHT:  FHR: 125-130 bpm, variability: moderate,  accelerations:  present,  decelerations:  none UC:   Q 6-8 minutes, irregular Dilation: 6.5 Effacement (%): 90 Station: -3 Presentation: Vertex Exam by:: Mary [place] Johnson, RNC-OB  Labs: Results for orders placed or performed during the hospital encounter of 07/18/23 (from the past 24 hours)  CBC     Status: Abnormal   Collection Time: 07/18/23 12:44 PM  Result Value Ref Range   WBC 14.1 (H) 4.0 - 10.5 K/uL   RBC 4.09 3.87 - 5.11 MIL/uL   Hemoglobin 10.4 (L) 12.0 - 15.0 g/dL   HCT 14.7 (L) 82.9 - 56.2 %   MCV 80.7 80.0 - 100.0 fL   MCH 25.4 (L) 26.0 - 34.0 pg   MCHC 31.5 30.0 - 36.0 g/dL   RDW 13.0 86.5 - 78.4 %   Platelets 161 150 - 400 K/uL   nRBC 0.0 0.0 - 0.2 %  Type and screen [facility]     Status: None   Collection Time: 07/18/23 12:44 PM  Result Value Ref Range   ABO/RH(D) A POS    Antibody Screen NEG    Sample Expiration      07/21/2023,2359 Performed at [facility], [address], [place], [place] [address]     Assessment / Plan: [redacted]w[redacted]d week IUP ROM x Hours: 6 Minutes: 25 GBS neg Labor: progessing, will recheck 2 hours from last SVE. Discussed expectant management vs active management based on next SVE. Pt agreeable to this plan. Fetal Wellbeing:  Category 1 Pain Control:  Epidural  Anticipated MOD:  SVD  Ky Barban 07/18/2023 10:23 PM

## 2023-07-18 NOTE — MAU Note (Signed)
.  Dawn Krause is a 23 y.o. at [redacted]w[redacted]d here in MAU reporting: CTX since 0300 that are now every 2-3 minutes. Denies VB or LOF. +FM.  Hx PTL/PTD at [redacted]w[redacted]d with a recent infant demise due to SIDS.  Last SVE in MAU patient was 360-3.  Onset of complaint: 0300 Pain score: 10/10 lower abdomen  Vitals:   07/18/23 1214  BP: 126/82  Pulse: (!) 106  Resp: 16  Temp: 98.6 F (37 C)  SpO2: 100%     FHT: 124 initial external Lab orders placed from triage: MAU Labor Eval

## 2023-07-18 NOTE — H&P (Addendum)
 OBSTETRIC ADMISSION HISTORY AND PHYSICAL  Dawn Krause is a 23 y.o. female [phone] with IUP at [redacted]w[redacted]d by [place] presenting for contractions. She reports +FMs, No LOF, no VB, no blurry vision, headaches or peripheral edema, and RUQ pain.  She plans on breast feeding. She is undecided for birth control  for birth control. She received her prenatal care at [facility]   Dating: By [place] --->  Estimated Date of Delivery: 08/05/23  Sono:    @[redacted]w[redacted]d , CWD, normal anatomy, breech presentation,  1056 g, 28% EFW   Prenatal History/Complications:  #Short interval btwn pregnancy  #Preterm delivery with infant demise (at 4 months) delivered at 33.1 weeks  #Depression affecting pregnancy  #Elevated bp readings   Past Medical History: Past Medical History:  Diagnosis Date   Anemia     Past Surgical History: Past Surgical History:  Procedure Laterality Date   NO PAST SURGERIES      Obstetrical History: OB History     Gravida  5   Para  4   Term  3   Preterm  1   AB      Living  3      SAB      IAB      Ectopic      Multiple  0   Live Births  4           Social History Social History   Socioeconomic History   Marital status: Single    Spouse name: Not on file   Number of children: Not on file   Years of education: Not on file   Highest education level: Not on file  Occupational History   Not on file  Tobacco Use   Smoking status: Never   Smokeless tobacco: Never  Vaping Use   Vaping status: Never Used  Substance and Sexual Activity   Alcohol use: Not Currently   Drug use: Not Currently   Sexual activity: Not Currently    Birth control/protection: None  Other Topics Concern   Not on file  Social History Narrative   Not on file   Social Drivers of Health   Financial Resource Strain: Not on file  Food Insecurity: No Food Insecurity (05/18/2022)   Hunger Vital Sign    Worried About Running Out of Food in the Last Year: Never true    Ran Out of Food in the Last  Year: Never true  Transportation Needs: No Transportation Needs (05/18/2022)   PRAPARE - Administrator, Civil Service (Medical): No    Lack of Transportation (Non-Medical): No  Physical Activity: Not on file  Stress: Not on file  Social Connections: Not on file    Family History: Family History  Problem Relation Age of Onset   Diabetes Mother    Hypertension Mother    Heart Problems Father    Cancer Maternal Grandfather    Diabetes Maternal Grandfather     Allergies: Allergies  Allergen Reactions   Gluten Meal Rash    Medications Prior to Admission  Medication Sig Dispense Refill Last Dose/Taking   escitalopram (LEXAPRO) 10 MG tablet Take 1.5 tablets (15 mg total) by mouth daily. 45 tablet 11 07/18/2023   Blood Pressure Monitoring (BLOOD PRESSURE KIT) DEVI 1 Device by Does not apply route once a week. 1 each 0    ferrous sulfate 325 (65 FE) MG tablet Take one tablet every OTHER morning with an acidic juice 30 tablet 3    Magnesium Oxide, Elemental, 400  MG TABS Take 1 tablet by mouth daily. 90 tablet 1    Misc. Devices (GOJJI WEIGHT SCALE) MISC 1 Device by Does not apply route once a week. 1 each 0    ondansetron (ZOFRAN-ODT) 4 MG disintegrating tablet Take 1 tablet (4 mg total) by mouth every 6 (six) hours as needed for nausea. 20 tablet 0    Prenatal Vit-Fe Phos-FA-Omega (VITAFOL GUMMIES) 3.33-0.333-34.8 MG CHEW Chew 3 tablets by mouth daily. 90 tablet 12    promethazine (PHENERGAN) 25 MG tablet Take 1 tablet (25 mg total) by mouth every 6 (six) hours as needed for nausea or vomiting. 30 tablet 1      Review of Systems   All systems reviewed and negative except as stated in HPI  Blood pressure 127/78, pulse (!) 113, temperature 98.6 F (37 C), temperature source Oral, resp. rate 16, height 5\' 2"  (1.575 m), weight 66.7 kg, last menstrual period 10/11/2022, SpO2 100%. General appearance: alert, cooperative, and appears stated age Lungs: clear to auscultation  bilaterally Heart: regular rate and rhythm Abdomen: soft, non-tender; bowel sounds normal Pelvic: see cervical exam  Extremities: Homans sign is negative, no sign of DVT Presentation: cephalic Fetal monitoringBaseline: 135 bpm/variable/15x15 acels  Uterine activityFrequency: Every 2-4 minutes Dilation: 6 Effacement (%): 60 Station: -3 Exam by:: Dickens, RN   Prenatal labs: ABO, Rh: A/Positive/-- (09/10 1443) Antibody: Negative (09/10 1443) Rubella: 1.68 (09/10 1443) RPR: Non Reactive (09/10 1443)  HBsAg: Negative (09/10 1443)  HIV: Non Reactive (09/10 1443)  GBS: Negative/-- (03/13 0000)    Lab Results  Component Value Date   GBS Negative 07/12/2023   GTT WNL Genetic screening  LR M Anatomy US appears normal   Immunization History  Administered Date(s) Administered   Tdap 05/31/2023    Prenatal Transfer Tool  Maternal Diabetes: No Genetic Screening: Normal Maternal Ultrasounds/Referrals: Normal Fetal Ultrasounds or other Referrals:  Referred to Materal Fetal Medicine  Maternal Substance Abuse:  No Significant Maternal Medications:  Meds include: Other: lexapro Significant Maternal Lab Results: Group B Strep negative Number of Prenatal Visits:greater than 3 verified prenatal visits Maternal Vaccinations:TDap Other Comments:  None   No results found for this or any previous visit (from the past 24 hours).  Patient Active Problem List   Diagnosis Date Noted   Depression affecting pregnancy 06/27/2023   Elevated blood pressure reading 06/27/2023   Short interval between pregnancies affecting pregnancy, antepartum 02/28/2023   History of preterm delivery, currently pregnant-hx PTL/PTD @33 .1 wks-recent infant demise 02/28/2023   Supervision of high risk pregnancy, antepartum 01/09/2023    Assessment/Plan:  Dawn Krause is a 23 y.o. [id number] at [redacted]w[redacted]d here for contractions.   #Labor:Progressing well. Augmentation not needed at this time. #Pain: Well controlled.  Pt declines epidural. #FWB: Cat 1 #GBS status:  negative #Feeding: Breastmilk  #Reproductive Life planning:  undecided btwn hormonal iud, nexplanon, depo  #Circ:  yes #Depression- was on lexapro not currently taking due to side effects Denton Ar, MD  07/18/2023, 12:37 PM\  Midwife attestation: I have seen and examined this patient; I agree with above documentation in the resident's note.   Ileta Ofarrell is a 23 y.o. [id number] here for  SOL  PE: BP 110/81   Pulse 72   Temp 98.2 F (36.8 C) (Oral)   Resp 18   Ht 5\' 2"  (1.575 m)   Wt 147 lb (66.7 kg)   LMP 10/11/2022 (Approximate)   SpO2 98%   Breastfeeding Unknown   BMI 26.89 kg/m  Gen: calm comfortable, NAD Resp: normal effort, no distress Abd: gravid  ROS, labs, PMH reviewed  Plan: Admit to LD Labor: expectant management Fetal monitoring: Category I ID: GBS neg  Sharen Counter, CNM  07/20/2023, 3:35 PM

## 2023-07-19 ENCOUNTER — Encounter: Payer: Medicaid Other | Admitting: Obstetrics and Gynecology

## 2023-07-19 ENCOUNTER — Encounter (HOSPITAL_COMMUNITY): Payer: Self-pay | Admitting: Family Medicine

## 2023-07-19 DIAGNOSIS — O0990 Supervision of high risk pregnancy, unspecified, unspecified trimester: Secondary | ICD-10-CM

## 2023-07-19 DIAGNOSIS — R030 Elevated blood-pressure reading, without diagnosis of hypertension: Secondary | ICD-10-CM

## 2023-07-19 DIAGNOSIS — Z3A37 37 weeks gestation of pregnancy: Secondary | ICD-10-CM

## 2023-07-19 DIAGNOSIS — O99344 Other mental disorders complicating childbirth: Secondary | ICD-10-CM

## 2023-07-19 DIAGNOSIS — O326XX0 Maternal care for compound presentation, not applicable or unspecified: Secondary | ICD-10-CM

## 2023-07-19 DIAGNOSIS — O99340 Other mental disorders complicating pregnancy, unspecified trimester: Secondary | ICD-10-CM

## 2023-07-19 DIAGNOSIS — O09899 Supervision of other high risk pregnancies, unspecified trimester: Secondary | ICD-10-CM

## 2023-07-19 DIAGNOSIS — O09293 Supervision of pregnancy with other poor reproductive or obstetric history, third trimester: Secondary | ICD-10-CM

## 2023-07-19 DIAGNOSIS — O4202 Full-term premature rupture of membranes, onset of labor within 24 hours of rupture: Secondary | ICD-10-CM

## 2023-07-19 LAB — RPR: RPR Ser Ql: NONREACTIVE

## 2023-07-19 MED ORDER — IBUPROFEN 600 MG PO TABS
600.0000 mg | ORAL_TABLET | Freq: Four times a day (QID) | ORAL | Status: DC
Start: 2023-07-19 — End: 2023-07-21
  Administered 2023-07-19 – 2023-07-21 (×7): 600 mg via ORAL
  Filled 2023-07-19 (×9): qty 1

## 2023-07-19 MED ORDER — SIMETHICONE 80 MG PO CHEW: 80.0000 mg | CHEWABLE_TABLET | ORAL | Status: DC | PRN

## 2023-07-19 MED ORDER — MEDROXYPROGESTERONE ACETATE 150 MG/ML IM SUSP: 150.0000 mg | INTRAMUSCULAR | Status: DC | PRN

## 2023-07-19 MED ORDER — PRENATAL MULTIVITAMIN CH
1.0000 | ORAL_TABLET | Freq: Every day | ORAL | Status: DC
  Administered 2023-07-19 – 2023-07-20 (×2): 1 via ORAL
  Filled 2023-07-19 (×3): qty 1

## 2023-07-19 MED ORDER — ONDANSETRON HCL 4 MG PO TABS: 4.0000 mg | ORAL_TABLET | ORAL | Status: DC | PRN

## 2023-07-19 MED ORDER — DIBUCAINE (PERIANAL) 1 % EX OINT: 1.0000 | TOPICAL_OINTMENT | CUTANEOUS | Status: DC | PRN

## 2023-07-19 MED ORDER — OXYCODONE HCL 5 MG PO TABS
5.0000 mg | ORAL_TABLET | ORAL | Status: DC | PRN
  Administered 2023-07-19: 5 mg via ORAL
  Filled 2023-07-19: qty 1

## 2023-07-19 MED ORDER — SENNOSIDES-DOCUSATE SODIUM 8.6-50 MG PO TABS
2.0000 | ORAL_TABLET | Freq: Every day | ORAL | Status: DC
  Administered 2023-07-20: 2 via ORAL
  Filled 2023-07-19: qty 2

## 2023-07-19 MED ORDER — TETANUS-DIPHTH-ACELL PERTUSSIS 5-2.5-18.5 LF-MCG/0.5 IM SUSY: 0.5000 mL | PREFILLED_SYRINGE | Freq: Once | INTRAMUSCULAR | Status: DC

## 2023-07-19 MED ORDER — BENZOCAINE-MENTHOL 20-0.5 % EX AERO
1.0000 | INHALATION_SPRAY | CUTANEOUS | Status: DC | PRN
  Filled 2023-07-19: qty 56

## 2023-07-19 MED ORDER — WITCH HAZEL-GLYCERIN EX PADS: 1.0000 | MEDICATED_PAD | CUTANEOUS | Status: DC | PRN

## 2023-07-19 MED ORDER — IBUPROFEN 600 MG PO TABS
600.0000 mg | ORAL_TABLET | Freq: Four times a day (QID) | ORAL | Status: DC
  Administered 2023-07-19 (×2): 600 mg via ORAL
  Filled 2023-07-19 (×2): qty 1

## 2023-07-19 MED ORDER — TERBUTALINE SULFATE 1 MG/ML IJ SOLN: 0.2500 mg | Freq: Once | INTRAMUSCULAR | Status: DC | PRN

## 2023-07-19 MED ORDER — ONDANSETRON HCL 4 MG/2ML IJ SOLN: 4.0000 mg | INTRAMUSCULAR | Status: DC | PRN

## 2023-07-19 MED ORDER — FERROUS SULFATE 325 (65 FE) MG PO TABS
325.0000 mg | ORAL_TABLET | ORAL | Status: DC
  Administered 2023-07-20: 325 mg via ORAL
  Filled 2023-07-19: qty 1

## 2023-07-19 MED ORDER — DIPHENHYDRAMINE HCL 25 MG PO CAPS: 25.0000 mg | ORAL_CAPSULE | Freq: Four times a day (QID) | ORAL | Status: DC | PRN

## 2023-07-19 MED ORDER — ACETAMINOPHEN 325 MG PO TABS
650.0000 mg | ORAL_TABLET | ORAL | Status: DC | PRN
  Administered 2023-07-19 – 2023-07-20 (×2): 650 mg via ORAL
  Filled 2023-07-19 (×2): qty 2

## 2023-07-19 MED ORDER — COCONUT OIL OIL: 1.0000 | TOPICAL_OIL | Status: DC | PRN

## 2023-07-19 MED ORDER — ZOLPIDEM TARTRATE 5 MG PO TABS: 5.0000 mg | ORAL_TABLET | Freq: Every evening | ORAL | Status: DC | PRN

## 2023-07-19 MED ORDER — OXYTOCIN-SODIUM CHLORIDE 30-0.9 UT/500ML-% IV SOLN
1.0000 m[IU]/min | INTRAVENOUS | Status: DC
  Administered 2023-07-19: 2 m[IU]/min via INTRAVENOUS

## 2023-07-19 NOTE — Lactation Note (Signed)
 This note was copied from a baby's chart. Lactation Consultation Note  Patient Name: Dawn Krause IONGE'X Date: 07/19/2023 Age:24 hours Reason for consult: Initial assessment;Early term 37-38.6wks Baby just got his shots. Mom holding baby. Mom stated baby eat before came to [facility]. Baby very sleepy. Not interested in BF at this time. Newborn feeding habits, STS, I&O, positioning reviewed. Mom encouraged to feed baby 8-12 times/24 hours and with feeding cues.  ( LC forgot to ask if mom BF her other children). Significant other getting stuff together to leave. Mom wants to eat breakfast. Encouraged mom to attempt to BF q 3hr and w/cues. Mom very tired. Asked mom to call for assistance when needed.   Maternal Data Has patient been taught Hand Expression?: Yes  Feeding    LATCH Score Latch: Too sleepy or reluctant, no latch achieved, no sucking elicited.  Audible Swallowing: None  Type of Nipple: Everted at rest and after stimulation (everted compressible)  Comfort (Breast/Nipple): Soft / non-tender  Hold (Positioning): Assistance needed to correctly position infant at breast and maintain latch.  LATCH Score: 5   Lactation Tools Discussed/Used    Interventions Interventions: Breast feeding basics reviewed;Assisted with latch;Skin to skin;Breast massage;Hand express;Breast compression;Adjust position;Support pillows;Position options;Education;LC Services brochure  Discharge    Consult Status Consult Status: Follow-up Date: 07/19/23 (pm) Follow-up type: In-patient    Charyl Dancer 07/19/2023, 5:21 AM

## 2023-07-19 NOTE — Anesthesia Postprocedure Evaluation (Signed)
 Anesthesia Post Note  Patient: Dawn Krause  Procedure(s) Performed: AN AD HOC LABOR EPIDURAL     Patient location during evaluation: Mother Baby Anesthesia Type: Epidural Level of consciousness: awake Pain management: satisfactory to patient Vital Signs Assessment: post-procedure vital signs reviewed and stable Respiratory status: spontaneous breathing Cardiovascular status: stable Anesthetic complications: no  No notable events documented.  Last Vitals:  Vitals:   07/19/23 1409 07/19/23 1648  BP: 117/77 118/66  Pulse: 66 72  Resp: 17 17  Temp: 36.4 C 36.4 C  SpO2: 98% 99%    Last Pain:  Vitals:   07/19/23 1648  TempSrc: Oral  PainSc: 0-No pain   Pain Goal:                Epidural/Spinal Function Cutaneous sensation: Normal sensation (07/19/23 1648), Patient able to flex knees: Yes (07/19/23 1648), Patient able to lift hips off bed: Yes (07/19/23 1648), Back pain beyond tenderness at insertion site: No (07/19/23 1648), Progressively worsening motor and/or sensory loss: No (07/19/23 1648), Bowel and/or bladder incontinence post epidural: No (07/19/23 1648)  Cephus Shelling

## 2023-07-19 NOTE — Discharge Summary (Signed)
 Postpartum Discharge Summary  Date of Service updated     Patient Name: Dawn Krause DOB: June 05, 2000 MRN: [id number]  Date of admission: 07/18/2023 Delivery date:07/19/2023 Delivering provider: Cam Hai D Date of discharge: 07/21/2023  Admitting diagnosis: Pregnancy [Z34.90] Intrauterine pregnancy: [redacted]w[redacted]d     Secondary diagnosis:  Principal Problem:   Pregnancy Active Problems:   Short interval between pregnancies affecting pregnancy, antepartum   History of preterm delivery, currently pregnant-hx PTL/PTD @33 .1 wks-recent infant demise   Depression affecting pregnancy  Additional problems: none    Discharge diagnosis: Term Pregnancy Delivered                                              Post partum procedures: none Augmentation: AROM and Pitocin Complications: None  Hospital course: Onset of Labor With Vaginal Delivery      23 y.o. yo [id number] at [redacted]w[redacted]d was admitted in Latent Labor on 07/18/2023. Labor course was uncomplicated.  Membrane Rupture Time/Date: 3:58 PM,07/18/2023  Delivery Method:Vaginal, Spontaneous Operative Delivery:N/A Episiotomy: None Lacerations:  None Patient had a postpartum course complicated by nothing.  She is ambulating, tolerating a regular diet, passing flatus, and urinating well. Patient is discharged home in stable condition on 07/21/23.  Newborn Data: Birth date:07/19/2023 Birth time:2:27 AM Gender:Female Living status:Living Apgars:8 ,9  Weight:3220 g (7lb 1.6oz)  Magnesium Sulfate received: No BMZ received: No Rhophylac:N/A MMR:N/A T-DaP:Given prenatally Flu: No RSV Vaccine received: No Transfusion:No  Immunizations received: Immunization History  Administered Date(s) Administered   Tdap 05/31/2023    Physical exam  Vitals:   07/20/23 0500 07/20/23 1347 07/20/23 1935 07/21/23 0532  BP: 110/80 110/81 115/68 109/70  Pulse: 60 72 60 61  Resp: 17 18 18 18   Temp:  98.2 F (36.8 C) 98.8 F (37.1 C) 98.7 F (37.1 C)   TempSrc:  Oral Oral Oral  SpO2: 98% 98% 100% 100%  Weight:      Height:       General: alert, cooperative, and no distress Lochia: appropriate Uterine Fundus: firm Incision: N/A DVT Evaluation: No evidence of DVT seen on physical exam. Labs: Lab Results  Component Value Date   WBC 14.1 (H) 07/18/2023   HGB 10.4 (L) 07/18/2023   HCT 33.0 (L) 07/18/2023   MCV 80.7 07/18/2023   PLT 161 07/18/2023      Latest Ref Rng & Units 06/27/2023    3:03 PM  CMP  Glucose 70 - 99 mg/dL 72   BUN 6 - 20 mg/dL 6   Creatinine 1.47 - 8.29 mg/dL 5.62   Sodium 130 - 865 mmol/L 136   Potassium 3.5 - 5.2 mmol/L 4.1   Chloride 96 - 106 mmol/L 103   CO2 20 - 29 mmol/L 21   Calcium 8.7 - 10.2 mg/dL 8.9   Total Protein 6.0 - 8.5 g/dL 7.1   Total Bilirubin 0.0 - 1.2 mg/dL 0.3   Alkaline Phos 44 - 121 IU/L 164   AST 0 - 40 IU/L 15   ALT 0 - 32 IU/L 6    Edinburgh Score:    07/19/2023    4:40 AM  Edinburgh Postnatal Depression Scale Screening Tool  I have been able to laugh and see the funny side of things. 0  I have looked forward with enjoyment to things. 0  I have blamed myself unnecessarily when things went wrong. 0  I have been anxious or worried for no good reason. 0  I have felt scared or panicky for no good reason. 0  Things have been getting on top of me. 0  I have been so unhappy that I have had difficulty sleeping. 0  I have felt sad or miserable. 0  I have been so unhappy that I have been crying. 0  The thought of harming myself has occurred to me. 0  Edinburgh Postnatal Depression Scale Total 0   No data recorded   After visit meds:  Allergies as of 07/21/2023       Reactions   Gluten Meal Rash        Medication List     STOP taking these medications    Blood Pressure Kit Devi   ferrous sulfate 325 (65 FE) MG tablet   Gojji Weight Scale Misc   ondansetron 4 MG disintegrating tablet Commonly known as: ZOFRAN-ODT   promethazine 25 MG tablet Commonly known  as: PHENERGAN       TAKE these medications    acetaminophen 325 MG tablet Commonly known as: Tylenol Take 2 tablets (650 mg total) by mouth every 4 (four) hours as needed (for pain scale < 4).   escitalopram 10 MG tablet Commonly known as: Lexapro Take 1.5 tablets (15 mg total) by mouth daily.   ibuprofen 600 MG tablet Commonly known as: ADVIL Take 1 tablet (600 mg total) by mouth every 6 (six) hours.   Magnesium Oxide (Elemental) 400 MG Tabs Take 1 tablet by mouth daily.   Vitafol Gummies 3.33-0.333-34.8 MG Chew Chew 3 tablets by mouth daily.         Discharge home in stable condition Infant Feeding: Breast Infant Disposition:home with mother Discharge instruction: per After Visit Summary and Postpartum booklet. Activity: Advance as tolerated. Pelvic rest for 6 weeks.  Diet: routine diet Future Appointments: Future Appointments  Date Time Provider Department Center  07/26/2023 10:00 AM Sheets, Ivin Booty, LCSW [facility] None  08/30/2023  1:30 PM Milas Hock, MD [facility]   Follow up Visit:  Follow-up Information     [facility] Follow up.   Specialty: Obstetrics and Gynecology Why: As needed in emergencies Contact information: [address] [place] [address] [phone]        [facility] for [facility] Follow up.   Specialty: Obstetrics and Gynecology Why: For postpartum visit Contact information: [address], [address] [place] [address] [phone]                Arabella Merles, CNM  P [facility] Support Pool Please schedule this patient for Postpartum visit in: 6 weeks with the following provider: Any provider In-Person For C/S patients schedule nurse incision check in weeks 2 weeks: no Low risk pregnancy complicated by: none Delivery mode:  SVD Anticipated Birth Control:  other/unsure - considering mirena vs  nexplanon vs depo.  PP Procedures needed: none Schedule Integrated BH visit: no   07/21/2023 Milas Hock, MD

## 2023-07-19 NOTE — Progress Notes (Signed)
 Patient ID: Dawn Krause, female   DOB: [birth year], 23 y.o.   MRN: [id number] Dawn Krause is a 23 y.o. [id number] at [redacted]w[redacted]d.  Subjective: Pt feeling intermittent pressure requesting SVE.  Objective: BP 116/66   Pulse 90   Temp 98.4 F (36.9 C) (Oral)   Resp 16   Ht 5\' 2"  (1.575 m)   Wt 66.7 kg   LMP 10/11/2022 (Approximate)   SpO2 99%   BMI 26.89 kg/m    FHT:  FHR: 120-130 bpm, variability: moderate,  accelerations:  present,  decelerations:  none UC:   Q 2-6 minutes, irregular Dilation: 7 Effacement (%): 90 Station: -1, 0 Presentation: Vertex Exam by:: Pamala Hurry, CNM student  Labs: Results for orders placed or performed during the hospital encounter of 07/18/23 (from the past 24 hours)  CBC     Status: Abnormal   Collection Time: 07/18/23 12:44 PM  Result Value Ref Range   WBC 14.1 (H) 4.0 - 10.5 K/uL   RBC 4.09 3.87 - 5.11 MIL/uL   Hemoglobin 10.4 (L) 12.0 - 15.0 g/dL   HCT 13.2 (L) 44.0 - 10.2 %   MCV 80.7 80.0 - 100.0 fL   MCH 25.4 (L) 26.0 - 34.0 pg   MCHC 31.5 30.0 - 36.0 g/dL   RDW 72.5 36.6 - 44.0 %   Platelets 161 150 - 400 K/uL   nRBC 0.0 0.0 - 0.2 %  Type and screen [facility]     Status: None   Collection Time: 07/18/23 12:44 PM  Result Value Ref Range   ABO/RH(D) A POS    Antibody Screen NEG    Sample Expiration      07/21/2023,2359 Performed at [facility], [address] [place], [place] [address]     Assessment / Plan: [redacted]w[redacted]d week IUP ROM x Hours: 9 Minutes: 39 BGS neg Labor: progressing well. Pitocin discussed earlier to increase intensity and frequency due to no cervical change after AROM, pt agreeable to plan and pit was started at 0052. She started feeling pressure less than an hour after starting pitocin and requested SVE. Fetal station has progressed well, labor positions were discussed to assist with dilatation. Fetal Wellbeing:  Category 1 Pain Control:  Epidural Anticipated MOD:  SVD  Herminio Commons,  Student-MidWife 07/19/2023 1:37 AM

## 2023-07-19 NOTE — Lactation Note (Signed)
 This note was copied from a baby's chart. Lactation Consultation Note  Patient Name: Dawn Krause WUJWJ'X Date: 07/19/2023 Age:24 hours, Experienced BF. Not as  Reason for consult: Follow-up assessment Baby due to feed and waking up as LC entered the room.  LC changed a wet diaper and offered to assist. Mom receptive,  LC assisted to place baby STS on the right breast football and baby latched with depth. Increased swallows with warm moist compress on the breast while the baby was feeding. Latch score 9.  LC reviewed the doc flow sheets - 1 attempt and now 2 feedings, wet x1 and HNS.  LC reviewed the 24 hour feeding goals of feeding cues and by 3 hours offer the breast STS   Maternal Data Has patient been taught Hand Expression?: Yes  Feeding    LATCH Score Latch: Grasps breast easily, tongue down, lips flanged, rhythmical sucking.  Audible Swallowing: Spontaneous and intermittent  Type of Nipple: Everted at rest and after stimulation  Comfort (Breast/Nipple): Soft / non-tender  Hold (Positioning): Assistance needed to correctly position infant at breast and maintain latch.  LATCH Score: 9   Lactation Tools Discussed/Used  Not as of now   Interventions Interventions: Breast feeding basics reviewed;Assisted with latch;Skin to skin;Breast massage;Hand express;Breast compression;Adjust position;Support pillows;Education;LC Services brochure  Discharge Pump: DEBP;Personal  Consult Status Consult Status: Follow-up Date: 07/20/23 Follow-up type: In-patient    Dawn Krause 07/19/2023, 2:07 PM

## 2023-07-20 NOTE — Progress Notes (Signed)
 Post Partum Day 1 Subjective: no complaints, up ad lib, voiding, and tolerating PO  Objective: Blood pressure 110/80, pulse 60, temperature 98.3 F (36.8 C), temperature source Oral, resp. rate 17, height 5\' 2"  (1.575 m), weight 66.7 kg, last menstrual period 10/11/2022, SpO2 98%, unknown if currently breastfeeding.  Physical Exam:  General: alert, cooperative, and no distress Lochia: appropriate Uterine Fundus: firm Incision: NA DVT Evaluation: No evidence of DVT seen on physical exam.  Recent Labs    07/18/23 1244  HGB 10.4*  HCT 33.0*    Assessment/Plan: Plan for discharge tomorrow, Breastfeeding, Social Work consult, Circumcision prior to discharge, and Contraception undecided   LOS: 2 days   [place] 07/20/2023, 12:20 PM

## 2023-07-20 NOTE — Lactation Note (Signed)
 This note was copied from a baby's chart. Lactation Consultation Note  Patient Name: Dawn Krause OZHYQ'M Date: 07/20/2023 Age:24 hours Reason for consult: Early term 37-38.6wks  P4, 37 wks, @ 36 hrs of life. Discussed steps of latching- encouraged working on big mouth latching. Discussed expectations @ breast- Day 1- sleepy/ feed every 3 hrs/ even 10 minutes is okay, Day 2 more awake/ feeding cues/longer feeds, and cluster feeding overnights brings milk in. Encouraged mom - infant doing well @ feeds and diapers- overnight try to increase feeding durations, offer both breasts each feeding to help infant battle initial weight loss. Highlighted breast stimulation is tied directly to milk production. Discussed hands on breast and baby, keeping baby awake @ breast. Starting with hand expression & breast compression to get baby working @ breast, and gentle stimulation to keep baby working @ breast. Encouraged EBM for nipple care post feed. LC services and milk storage shared. Hand pump provided. Encouraged mom to call for assist anytime desired.  Maternal Data Does the patient have breastfeeding experience prior to this delivery?: Yes  Feeding Mother's Current Feeding Choice: Breast Milk  Lactation Tools Discussed/Used Tools: Pump Breast pump type: Manual Pump Education: Milk Storage  Interventions Interventions: Breast feeding basics reviewed;Hand express;Breast compression;Expressed milk;Hand pump;Education;LC Services brochure;CDC milk storage guidelines  Discharge Pump: DEBP;Manual;Personal (Per mom has a DEBP @ home, hand pump provided today- encouraged best tool for softening engorged breasts.)  Consult Status Consult Status: Follow-up Date: 07/21/23 Follow-up type: In-patient    [facility] 07/20/2023, 3:07 PM
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
## 2023-07-21 MED ORDER — IBUPROFEN 600 MG PO TABS: 600.0000 mg | ORAL_TABLET | Freq: Four times a day (QID) | ORAL | 0 refills | Status: AC

## 2023-07-21 MED ORDER — ACETAMINOPHEN 325 MG PO TABS: 650.0000 mg | ORAL_TABLET | ORAL | 0 refills | Status: AC | PRN

## 2023-07-21 NOTE — Lactation Note (Signed)
 This note was copied from a baby's chart. Lactation Consultation Note  Patient Name: Dawn Krause [id number] Date: 07/21/2023 Age:24 hours Reason for consult: Early term 37-38.6wks;Maternal discharge  P4, 37 wks, @ 57 hrs of life. Mom/baby in side lying position- recently fed. Praised mom- only experienced moms be making this position work in hospital. 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 Discussed cluster feeding overnight/ early morning brings in our milk supply, shared expectations of milk coming in. Highlighted risk of engorgement. Discussed hand pump/express to soften breasts, motrin as anti-inflammatory, and ice packs for 10-20 minutes post feed/pumping if still over-full is the best treatments for inflamed/engorged breasts. Re-enforced LC services and milk storage.   Maternal Data Has patient been taught Hand Expression?: Yes Does the patient have breastfeeding experience prior to this delivery?: Yes  Feeding Mother's Current Feeding Choice: Breast Milk   Interventions Interventions: Breast feeding basics reviewed;Hand express;Breast compression;Expressed milk;Hand pump;Education;LC Services brochure;CDC milk storage guidelines  Discharge Discharge Education: Engorgement and breast care  Consult Status Consult Status: Complete Date: 07/21/23    Dawn Krause 07/21/2023, 12:07 PM

## 2023-07-26 ENCOUNTER — Encounter: Payer: Medicaid Other | Admitting: Obstetrics and Gynecology

## 2023-07-26 ENCOUNTER — Ambulatory Visit (HOSPITAL_COMMUNITY): Admitting: Licensed Clinical Social Worker

## 2023-07-27 MED FILL — Oxytocin Inj 10 Unit/ML: INTRAMUSCULAR | Qty: 3 | Status: AC

## 2023-08-01 ENCOUNTER — Telehealth (HOSPITAL_COMMUNITY): Payer: Self-pay | Admitting: *Deleted

## 2023-08-01 NOTE — Telephone Encounter (Signed)
 08/01/2023  Name: Dawn Krause MRN: [id number] DOB: [birth year]  Reason for Call:  Transition of Care Hospital Discharge Call  Contact Status: Patient Contact Status: Complete  Language assistant needed:          Follow-Up Questions: Do You Have Any Concerns About Your Health As You Heal From Delivery?: Yes What Concerns Do You Have About Your Health?: Patient verbalized a question regarding a prescription for Tyelenol. RN instructed patient to contact her provider for prescription questions. Patient verbalized understanding. Do You Have Any Concerns About Your Infants Health?: No  Edinburgh Postnatal Depression Scale:  In the Past 7 Days: I have been able to laugh and see the funny side of things.: As much as I always could I have looked forward with enjoyment to things.: Rather less than I used to I have blamed myself unnecessarily when things went wrong.: No, never I have been anxious or worried for no good reason.: Yes, sometimes I have felt scared or panicky for no good reason.: No, not at all Things have been getting on top of me.: No, I have been coping as well as ever I have been so unhappy that I have had difficulty sleeping.: Not at all I have felt sad or miserable.: No, not at all I have been so unhappy that I have been crying.: Only occasionally The thought of harming myself has occurred to me.: Never Edinburgh Postnatal Depression Scale Total: 4  PHQ2-9 Depression Scale:     Discharge Follow-up: Edinburgh score requires follow up?: No Patient was advised of the following resources:: Breastfeeding Support Group, Support Group  Post-discharge interventions: Reviewed Newborn Safe Sleep Practices  Signature Deforest Hoyles, RN, 08/01/23, [phone]

## 2023-08-13 ENCOUNTER — Ambulatory Visit (HOSPITAL_COMMUNITY): Admitting: Licensed Clinical Social Worker

## 2023-08-21 ENCOUNTER — Ambulatory Visit (INDEPENDENT_AMBULATORY_CARE_PROVIDER_SITE_OTHER): Admitting: Licensed Clinical Social Worker

## 2023-08-21 DIAGNOSIS — F411 Generalized anxiety disorder: Secondary | ICD-10-CM | POA: Diagnosis not present

## 2023-08-21 NOTE — Progress Notes (Signed)
 THERAPIST PROGRESS NOTE  Session Time: 2:13 pm-2:48 pm  Type of Therapy: Individual Therapy  Purpose of session/Treatment Goals addressed: Dawn Krause will manage mood and anxiety as evidenced by learning to express emotions appropriately based off past traumas, challenge depressive/anxious thoughts, and increase coping skills to deal with triggers for 5 out of 7 days for 60 days.   Interventions: Therapist utilized CBT, Solution Focused brief therapy,  and ACT  to address mood and anxiety. Therapist provided support and empathy to patient during session. Therapist administered the PHQ9 and GAD7.  Therapist worked with patient on expressing emotions appropriately and dealing with triggers.    Effectiveness: Patient was oriented x4 (person, place, situation, and time) . Patient was  Anxious and Depressed. Patient was Casually dressed. Patient completed a PHQ9 and GAD7.  Patient brought her new born son to session with her. He is healthy and has been cluster feeding. Patient is breastfeeding her baby. She felt liike her experience was different in the hospital this time. With her previous children she was in labor for a very short time and within half an hour of being at the hospital would give birth. Her experience this time was different. She was in labor for several hours and they had to help induce labor. Everything went well. She is keeping an extra close eye on their son and so is her partner. She will be returning back to work next week. She noted she has been more focused on work than being "motherly" in the past. She does a good job for her children and is there for them but feels like she is more "masculine" in that sense that she doesn't mind working rather than staying home with the children. Patient noted that their roommate had her baby as well. She noted that the roommate is supposed to be moving out at some point. She has heard her roommate "yell" at her newborn because it was crying. Patient  doesn't like that because it is a baby and the baby doesn't understand. She will be glad when the roommate has a place of her own.    Patient engaged in session. Patient responded well to interventions. Patient continues to meet criteria for Generalized anxiety disorder  Patient will continue in outpatient therapy due to being the least restrictive service to meet her needs. Patient made minimal progress on her goals at this time.      08/21/2023    2:17 PM 07/13/2023   11:03 AM 06/27/2023    1:10 PM 06/19/2023   11:23 AM 06/19/2023   11:05 AM  Depression screen PHQ 2/9  Decreased Interest 2 3 2  2   Down, Depressed, Hopeless 1 2 3  2   PHQ - 2 Score 3 5 5  4   Altered sleeping 2 3 3 3 3   Tired, decreased energy 2 3 3 2 3   Change in appetite 1 3 3 2 2   Feeling bad or failure about yourself  2 3 3 2 3   Trouble concentrating 2 3 2 2 3   Moving slowly or fidgety/restless 0 2 0 1 2  Suicidal thoughts 0 0 0 0 0  PHQ-9 Score 12 22 19  20   Difficult doing work/chores     Extremely dIfficult       08/21/2023    2:17 PM 07/13/2023   11:04 AM 06/27/2023    1:11 PM 06/19/2023   11:06 AM  GAD 7 : Generalized Anxiety Score  Nervous, Anxious, on Edge 2 2 3  3  Control/stop worrying 2 2 2 3   Worry too much - different things 1 3 3 3   Trouble relaxing 2 3 3 3   Restless 1 3 3 3   Easily annoyed or irritable 3 2 2 3   Afraid - awful might happen 0 0 0 0  Total GAD 7 Score 11 15 16 18   Anxiety Difficulty Extremely difficult Extremely difficult Extremely difficult Extremely difficult        Suicidal/Homicidal:  No without intent/plan   Feelings of depression and/or anhedonia  Protective Factors: responsibility to others (children, family)  Plan: Patient will return in 2-4 weeks  Diagnosis: Axis I: Generalized anxiety disorder    Collaboration of Care: Other to be identified.   Patient/Guardian was advised Release of Information must be obtained prior to any record release in order to collaborate  their care with an outside provider. Patient/Guardian was advised if they have not already done so to contact the registration department to sign all necessary forms in order for us  to release information regarding their care.   Consent: Patient/Guardian gives verbal consent for treatment and assignment of benefits for services provided during this visit. Patient/Guardian expressed understanding and agreed to proceed.

## 2023-08-28 ENCOUNTER — Ambulatory Visit (HOSPITAL_COMMUNITY): Admitting: Psychiatry

## 2023-08-28 ENCOUNTER — Ambulatory Visit (HOSPITAL_COMMUNITY): Admitting: Licensed Clinical Social Worker

## 2023-08-30 ENCOUNTER — Telehealth: Payer: Self-pay | Admitting: *Deleted

## 2023-08-30 ENCOUNTER — Ambulatory Visit: Admitting: Obstetrics and Gynecology

## 2023-08-30 NOTE — Telephone Encounter (Signed)
 Left patient a message to call and reschedule missed appointment.

## 2023-09-04 ENCOUNTER — Ambulatory Visit (HOSPITAL_COMMUNITY): Admitting: Psychiatry

## 2023-09-04 ENCOUNTER — Ambulatory Visit: Admitting: Obstetrics and Gynecology

## 2023-09-14 ENCOUNTER — Encounter (HOSPITAL_COMMUNITY): Payer: Self-pay | Admitting: Licensed Clinical Social Worker

## 2023-09-14 ENCOUNTER — Ambulatory Visit (HOSPITAL_COMMUNITY): Admitting: Licensed Clinical Social Worker

## 2023-09-18 ENCOUNTER — Ambulatory Visit (HOSPITAL_COMMUNITY): Admitting: Psychiatry

## 2023-09-25 ENCOUNTER — Ambulatory Visit (HOSPITAL_COMMUNITY): Admitting: Psychiatry

## 2023-09-28 ENCOUNTER — Ambulatory Visit (HOSPITAL_COMMUNITY): Admitting: Licensed Clinical Social Worker
